# Patient Record
Sex: Female | Born: 1960 | Race: Black or African American | Hispanic: No | Marital: Single | Smoking: Never smoker
Health system: Southern US, Community
[De-identification: ages and names within clinical notes are randomized; demographics above are authoritative.]

## PROBLEM LIST (undated history)

## (undated) DIAGNOSIS — K219 Gastro-esophageal reflux disease without esophagitis: Secondary | ICD-10-CM

## (undated) DIAGNOSIS — J45909 Unspecified asthma, uncomplicated: Secondary | ICD-10-CM

## (undated) DIAGNOSIS — I10 Essential (primary) hypertension: Secondary | ICD-10-CM

## (undated) DIAGNOSIS — F32A Depression, unspecified: Secondary | ICD-10-CM

## (undated) DIAGNOSIS — E119 Type 2 diabetes mellitus without complications: Secondary | ICD-10-CM

## (undated) DIAGNOSIS — E876 Hypokalemia: Secondary | ICD-10-CM

## (undated) DIAGNOSIS — K589 Irritable bowel syndrome without diarrhea: Secondary | ICD-10-CM

## (undated) DIAGNOSIS — G4730 Sleep apnea, unspecified: Secondary | ICD-10-CM

## (undated) DIAGNOSIS — N183 Chronic kidney disease, stage 3 unspecified: Secondary | ICD-10-CM

## (undated) DIAGNOSIS — E039 Hypothyroidism, unspecified: Secondary | ICD-10-CM

## (undated) DIAGNOSIS — F329 Major depressive disorder, single episode, unspecified: Secondary | ICD-10-CM

## (undated) HISTORY — DX: Chronic kidney disease, stage 3 (moderate): N18.3

## (undated) HISTORY — DX: Hypokalemia: E87.6

## (undated) HISTORY — DX: Hypothyroidism, unspecified: E03.9

## (undated) HISTORY — DX: Sleep apnea, unspecified: G47.30

## (undated) HISTORY — DX: Chronic kidney disease, stage 3 unspecified: N18.30

## (undated) HISTORY — PX: BACK SURGERY: SHX140

## (undated) HISTORY — PX: OTHER SURGICAL HISTORY: SHX169

## (undated) HISTORY — PX: ABDOMINAL HYSTERECTOMY: SHX81

## (undated) HISTORY — DX: Gastro-esophageal reflux disease without esophagitis: K21.9

## (undated) HISTORY — DX: Type 2 diabetes mellitus without complications: E11.9

## (undated) HISTORY — DX: Depression, unspecified: F32.A

## (undated) HISTORY — DX: Major depressive disorder, single episode, unspecified: F32.9

## (undated) HISTORY — DX: Essential (primary) hypertension: I10

## (undated) HISTORY — PX: RETINAL TEAR REPAIR CRYOTHERAPY: SHX5304

## (undated) HISTORY — DX: Unspecified asthma, uncomplicated: J45.909

---

## 2001-07-12 DIAGNOSIS — M549 Dorsalgia, unspecified: Secondary | ICD-10-CM | POA: Insufficient documentation

## 2001-07-12 DIAGNOSIS — J309 Allergic rhinitis, unspecified: Secondary | ICD-10-CM | POA: Insufficient documentation

## 2001-07-12 DIAGNOSIS — Z9189 Other specified personal risk factors, not elsewhere classified: Secondary | ICD-10-CM | POA: Insufficient documentation

## 2006-11-29 DIAGNOSIS — E876 Hypokalemia: Secondary | ICD-10-CM | POA: Insufficient documentation

## 2008-12-12 DIAGNOSIS — M5416 Radiculopathy, lumbar region: Secondary | ICD-10-CM | POA: Insufficient documentation

## 2009-05-22 DIAGNOSIS — M519 Unspecified thoracic, thoracolumbar and lumbosacral intervertebral disc disorder: Secondary | ICD-10-CM | POA: Insufficient documentation

## 2009-05-22 DIAGNOSIS — M25512 Pain in left shoulder: Secondary | ICD-10-CM | POA: Insufficient documentation

## 2010-06-19 DIAGNOSIS — N83209 Unspecified ovarian cyst, unspecified side: Secondary | ICD-10-CM | POA: Insufficient documentation

## 2010-06-19 DIAGNOSIS — F419 Anxiety disorder, unspecified: Secondary | ICD-10-CM | POA: Insufficient documentation

## 2010-06-19 DIAGNOSIS — Z90711 Acquired absence of uterus with remaining cervical stump: Secondary | ICD-10-CM | POA: Insufficient documentation

## 2011-06-21 DIAGNOSIS — R200 Anesthesia of skin: Secondary | ICD-10-CM | POA: Insufficient documentation

## 2012-01-04 DIAGNOSIS — M2241 Chondromalacia patellae, right knee: Secondary | ICD-10-CM | POA: Insufficient documentation

## 2012-01-27 DIAGNOSIS — M94269 Chondromalacia, unspecified knee: Secondary | ICD-10-CM | POA: Insufficient documentation

## 2012-02-12 DIAGNOSIS — M25461 Effusion, right knee: Secondary | ICD-10-CM | POA: Insufficient documentation

## 2012-02-12 DIAGNOSIS — M25569 Pain in unspecified knee: Secondary | ICD-10-CM | POA: Insufficient documentation

## 2012-08-24 DIAGNOSIS — Z9114 Patient's other noncompliance with medication regimen: Secondary | ICD-10-CM | POA: Insufficient documentation

## 2014-09-20 DIAGNOSIS — G8929 Other chronic pain: Secondary | ICD-10-CM | POA: Insufficient documentation

## 2015-01-28 DIAGNOSIS — J4520 Mild intermittent asthma, uncomplicated: Secondary | ICD-10-CM | POA: Insufficient documentation

## 2015-04-10 DIAGNOSIS — K582 Mixed irritable bowel syndrome: Secondary | ICD-10-CM | POA: Insufficient documentation

## 2015-07-22 DIAGNOSIS — G4733 Obstructive sleep apnea (adult) (pediatric): Secondary | ICD-10-CM | POA: Insufficient documentation

## 2015-07-23 DIAGNOSIS — Z79899 Other long term (current) drug therapy: Secondary | ICD-10-CM | POA: Insufficient documentation

## 2015-07-23 DIAGNOSIS — N183 Chronic kidney disease, stage 3 unspecified: Secondary | ICD-10-CM | POA: Insufficient documentation

## 2015-08-14 DIAGNOSIS — F321 Major depressive disorder, single episode, moderate: Secondary | ICD-10-CM | POA: Insufficient documentation

## 2015-09-26 DIAGNOSIS — M170 Bilateral primary osteoarthritis of knee: Secondary | ICD-10-CM | POA: Insufficient documentation

## 2016-06-22 DIAGNOSIS — M5126 Other intervertebral disc displacement, lumbar region: Secondary | ICD-10-CM | POA: Insufficient documentation

## 2016-08-11 DIAGNOSIS — Z111 Encounter for screening for respiratory tuberculosis: Secondary | ICD-10-CM | POA: Insufficient documentation

## 2016-08-26 LAB — HEMOGLOBIN A1C: HEMOGLOBIN A1C: 11.8

## 2016-11-16 DIAGNOSIS — R079 Chest pain, unspecified: Secondary | ICD-10-CM | POA: Insufficient documentation

## 2016-11-16 DIAGNOSIS — E119 Type 2 diabetes mellitus without complications: Secondary | ICD-10-CM | POA: Insufficient documentation

## 2017-01-28 LAB — HEMOGLOBIN A1C: Hemoglobin A1C: 12

## 2017-04-26 LAB — HEMOGLOBIN A1C: HEMOGLOBIN A1C: 10.4

## 2017-06-01 ENCOUNTER — Ambulatory Visit (INDEPENDENT_AMBULATORY_CARE_PROVIDER_SITE_OTHER): Payer: 59 | Admitting: "Endocrinology

## 2017-06-01 ENCOUNTER — Encounter: Payer: Self-pay | Admitting: "Endocrinology

## 2017-06-01 VITALS — BP 133/83 | HR 86 | Ht 64.25 in | Wt 263.0 lb

## 2017-06-01 DIAGNOSIS — I10 Essential (primary) hypertension: Secondary | ICD-10-CM | POA: Diagnosis not present

## 2017-06-01 DIAGNOSIS — E1165 Type 2 diabetes mellitus with hyperglycemia: Secondary | ICD-10-CM | POA: Diagnosis not present

## 2017-06-01 DIAGNOSIS — E039 Hypothyroidism, unspecified: Secondary | ICD-10-CM

## 2017-06-01 MED ORDER — GLUCOSE BLOOD VI STRP: ORAL_STRIP | 2 refills | Status: DC

## 2017-06-01 MED ORDER — INSULIN PEN NEEDLE 31G X 8 MM MISC: 1.0000 | 3 refills | Status: AC

## 2017-06-01 MED ORDER — INSULIN GLARGINE 100 UNIT/ML SOLOSTAR PEN: 20.0000 [IU] | PEN_INJECTOR | Freq: Every day | SUBCUTANEOUS | 2 refills | Status: DC

## 2017-06-01 MED ORDER — ACCU-CHEK GUIDE W/DEVICE KIT: 1.0000 | PACK | 0 refills | Status: DC

## 2017-06-01 MED ORDER — LANCETS MISC: 1.0000 | 3 refills | Status: DC

## 2017-06-01 NOTE — Patient Instructions (Signed)

## 2017-06-01 NOTE — Progress Notes (Signed)
Consult Note       06/01/2017, 3:37 PM   Subjective:    Patient ID: Sharon Carson, female    DOB: June 12, 1960.  Sharon Carson is being seen in consultation for management of currently uncontrolled symptomatic diabetes requested by  Eber Hong, MD.   Past Medical History:  Diagnosis Date  . Asthma   . CKD (chronic kidney disease), stage III ([place])   . Depression   . Diabetes mellitus, type II ([place])   . GERD (gastroesophageal reflux disease)   . Hypertension   . Hypokalemia   . Hypothyroidism   . Sleep apnea    Past Surgical History:  Procedure Laterality Date  . ABDOMINAL HYSTERECTOMY    . CESAREAN SECTION    . RETINAL TEAR REPAIR CRYOTHERAPY     Social History   Socioeconomic History  . Marital status: Single    Spouse name: None  . Number of children: None  . Years of education: None  . Highest education level: None  Social Needs  . Financial resource strain: None  . Food insecurity - worry: None  . Food insecurity - inability: None  . Transportation needs - medical: None  . Transportation needs - non-medical: None  Occupational History  . None  Tobacco Use  . Smoking status: Never Smoker  . Smokeless tobacco: Never Used  Substance and Sexual Activity  . Alcohol use: No    Frequency: Never  . Drug use: No  . Sexual activity: None  Other Topics Concern  . None  Social History Narrative  . None   Outpatient Encounter Medications as of 06/01/2017  Medication Sig  . irbesartan (AVAPRO) 150 MG tablet Take 150 mg by mouth daily.  Marland Kitchen levothyroxine (SYNTHROID, LEVOTHROID) 88 MCG tablet Take 88 mcg by mouth daily before breakfast.  . metFORMIN (GLUCOPHAGE) 1000 MG tablet Take 1,000 mg by mouth 2 (two) times daily with a meal.  . pioglitazone (ACTOS) 30 MG tablet Take 30 mg by mouth 2 (two) times daily.  . [DISCONTINUED] glipiZIDE (GLUCOTROL) 5 MG tablet Take 5 mg by mouth  daily before breakfast.  . Blood Glucose Monitoring Suppl (ACCU-CHEK GUIDE) w/Device KIT 1 Piece by Does not apply route as directed.  Marland Kitchen glucose blood (ACCU-CHEK GUIDE) test strip Use as instructed  . Insulin Glargine (LANTUS SOLOSTAR) 100 UNIT/ML Solostar Pen Inject 20 Units into the skin daily at 10 pm.  . Insulin Pen Needle (B-D ULTRAFINE III SHORT PEN) 31G X 8 MM MISC 1 each by Does not apply route as directed.  . Lancets MISC 1 each by Does not apply route as directed.   No facility-administered encounter medications on file as of 06/01/2017.     ALLERGIES: Allergies  Allergen Reactions  . Bactrim [Sulfamethoxazole-Trimethoprim]   . Hychlor [Hydrochlorothiazide]   . Norvasc [Amlodipine Besylate]     VACCINATION STATUS:  There is no immunization history on file for this patient.  Diabetes  She presents for her initial diabetic visit. She has type 2 diabetes mellitus. Onset time: She was diagnosed at approximate age of 44 years. Her disease course has been improving (Her last 3  A1c's are 12% in October 2018, 11.8% in May 2018, and 10.4% on April 26, 2017.). There are no hypoglycemic associated symptoms. Pertinent negatives for hypoglycemia include no confusion, headaches, pallor or seizures. Associated symptoms include fatigue, polydipsia and polyuria. Pertinent negatives for diabetes include no chest pain and no polyphagia. There are no hypoglycemic complications. Symptoms are improving. There are no diabetic complications. Risk factors for coronary artery disease include diabetes mellitus, hypertension, obesity and sedentary lifestyle. Current diabetic treatment includes oral agent (triple therapy). Her weight is increasing steadily. She is following a generally unhealthy diet. When asked about meal planning, she reported none. She has not had a previous visit with a dietitian. She never participates in exercise. (She did not bring any meter no logs to review today.  She admits that she  does not have a meter.  She is trying to be cleared for shoulder surgery, however willing to be treated for her diabetes before her surgery.) An ACE inhibitor/angiotensin II receptor blocker is being taken. She does not see a podiatrist.Eye exam is not current.  Hypertension  This is a chronic problem. The current episode started more than 1 year ago. The problem is controlled. Pertinent negatives include no chest pain, headaches, palpitations or shortness of breath. Risk factors for coronary artery disease include diabetes mellitus, dyslipidemia, obesity and sedentary lifestyle. Past treatments include angiotensin blockers.      Review of Systems  Constitutional: Positive for fatigue. Negative for chills, fever and unexpected weight change.  HENT: Negative for trouble swallowing and voice change.   Eyes: Negative for visual disturbance.  Respiratory: Negative for cough, shortness of breath and wheezing.   Cardiovascular: Negative for chest pain, palpitations and leg swelling.  Gastrointestinal: Negative for diarrhea, nausea and vomiting.  Endocrine: Positive for polydipsia and polyuria. Negative for cold intolerance, heat intolerance and polyphagia.  Musculoskeletal: Negative for arthralgias and myalgias.  Skin: Negative for color change, pallor, rash and wound.  Neurological: Negative for seizures and headaches.  Psychiatric/Behavioral: Negative for confusion and suicidal ideas.    Objective:    BP 133/83   Pulse 86   Ht 5' 4.25" (1.632 m)   Wt 263 lb (119.3 kg)   BMI 44.79 kg/m   Wt Readings from Last 3 Encounters:  06/01/17 263 lb (119.3 kg)     Physical Exam  Constitutional: She is oriented to person, place, and time. She appears well-developed.  HENT:  Head: Normocephalic and atraumatic.  Eyes: EOM are normal.  Neck: Normal range of motion. Neck supple. No tracheal deviation present. No thyromegaly present.  Cardiovascular: Normal rate and regular rhythm.   Pulmonary/Chest: Effort normal and breath sounds normal.  Abdominal: Soft. Bowel sounds are normal. There is no tenderness. There is no guarding.  Obese.  Musculoskeletal: She exhibits no edema.  Her right arm is in slings with limited range of motion of right shoulder.  Neurological: She is alert and oriented to person, place, and time. She has normal reflexes. No cranial nerve deficit. Coordination normal.  Skin: Skin is warm and dry. No rash noted. No erythema. No pallor.  Psychiatric: She has a normal mood and affect. Judgment normal.    Recent Results (from the past 2160 hour(s))  Hemoglobin A1c     Status: None   Collection Time: 04/26/17 12:00 AM  Result Value Ref Range   Hemoglobin A1C 10.4         Assessment & Plan:   1. Uncontrolled type 2 diabetes mellitus with hyperglycemia ([place])  -  Sharon Carson has currently uncontrolled symptomatic type 2 DM since 57 years of age,  with most recent A1c of 10.4 %. Recent labs reviewed.  Her prior to A1c measurements were 11.8% and 12%.  -her diabetes is complicated by obesity/sedentary life and Sharon Carson remains at a high risk for more acute and chronic complications which include CAD, CVA, CKD, retinopathy, and neuropathy. These are all discussed in detail with the patient.  - I have counseled her on diet management and weight loss, by adopting a carbohydrate restricted/protein rich diet.  - Suggestion is made for her to avoid simple carbohydrates  from her diet including Cakes, Sweet Desserts, Ice Cream, Soda (diet and regular), Sweet Tea, Candies, Chips, Cookies, Store Bought Juices, Alcohol in Excess of  1-2 drinks a day, Artificial Sweeteners, and "Sugar-free" Products. This will help patient to have stable blood glucose profile and potentially avoid unintended weight gain.  - I encouraged her to switch to  unprocessed or minimally processed complex starch and increased protein intake (animal or plant source),  fruits, and vegetables.  - she is advised to stick to a routine mealtimes to eat 3 meals  a day and avoid unnecessary snacks ( to snack only to correct hypoglycemia).   - she will be scheduled with Jearld Fenton, RDN, CDE for individualized diabetes education.  - I have approached her with the following individualized plan to manage diabetes and patient agrees:   -She is being evaluated for rotator cuff surgery on her right shoulder electively.  Currently she is not cleared from diabetes point of view.  -She is willing to engage to treat his diabetes before her surgery.  -She may require intensive treatment with basal/bolus insulin in order for her to achieve control of diabetes to target.  -I discussed and initiated basal insulin Lantus at 20 units nightly, associated with  strict monitoring of glucose 4 times a day-before meals and at bedtime. -She is requested to return in 1 week with her meter and logs for reevaluation and insulin dose adjustment. -Testing supplies were prescribed for her.  -Patient is encouraged to call clinic for blood glucose levels less than 70 or above 200 mg /dl. - I will continue metformin 1000 mg p.o. twice daily and lower  Actos to  30 mg p.o. daily, therapeutically suitable for patient . - I will discontinue glipizide, risk outweighs benefit for this patient.  - she will be considered for incretin therapy as appropriate next visit. - Patient specific target  A1c;  LDL, HDL, Triglycerides, and  Waist Circumference were discussed in detail.  2) BP/HTN: Her blood pressure is controlled to target. Continue current medications including irbesartan 150 mg p.o. every morning. 3) Lipids/HPL: No recent lipid panel to review, she is not on statin medications.  She will have fasting lipid panel on subsequent visits. 4)  Weight/Diet: CDE Consult will be initiated , exercise, and detailed carbohydrates information provided.  5) hypothyroidism: Clinically euthyroid, no  recent thyroid function test to review. -I have advised her to continue levothyroxine 88 mcg p.o. every morning.  - We discussed about correct intake of levothyroxine, at fasting, with water, separated by at least 30 minutes from breakfast, and separated by more than 4 hours from calcium, iron, multivitamins, acid reflux medications (PPIs). -Patient is made aware of the fact that thyroid hormone replacement is needed for life, dose to be adjusted by periodic monitoring of thyroid function tests.  6) Chronic Care/Health Maintenance:  -she  Is ARB  and not on  Statin medications and  is encouraged to continue to follow up with Ophthalmology, Dentist,  Podiatrist at least yearly or according to recommendations, and advised to   stay away from smoking. I have recommended yearly flu vaccine and pneumonia vaccination at least every 5 years; moderate intensity exercise for up to 150 minutes weekly; and  sleep for at least 7 hours a day. -Once her average blood glucose drops to below 180 mg/dL, she may be considered for her elective shoulder surgery.  - I advised patient to maintain close follow up with Eber Hong, MD for primary care needs.  - Time spent with the patient: 1 hour, of which >50% was spent in obtaining information about her symptoms, reviewing her previous labs, evaluations, and treatments, counseling her about her currently uncontrolled type 2 diabetes, hypertension, and developing a plan for long term treatment; her  questions were answered to her satisfaction.  Follow up plan: - Return in about 1 week (around 06/08/2017) for follow up with meter and logs- no labs.  Glade Lloyd, MD [facility] [address] [place], [place] [address] Phone: [phone]  Fax: [phone]    06/01/2017, 3:37 PM  This note was partially dictated with voice recognition software. Similar sounding words can be transcribed inadequately or may not  be  corrected upon review.

## 2017-06-02 ENCOUNTER — Telehealth: Payer: Self-pay | Admitting: "Endocrinology

## 2017-06-02 ENCOUNTER — Other Ambulatory Visit: Payer: Self-pay

## 2017-06-02 MED ORDER — BLOOD GLUCOSE MONITOR KIT: PACK | 5 refills | Status: DC

## 2017-06-02 MED ORDER — LANCETS MISC: 1.0000 | Freq: Four times a day (QID) | 5 refills | Status: DC

## 2017-06-02 MED ORDER — TRUE METRIX METER W/DEVICE KIT: PACK | 0 refills | Status: DC

## 2017-06-02 MED ORDER — GLUCOSE BLOOD VI STRP: ORAL_STRIP | 5 refills | Status: DC

## 2017-06-02 NOTE — Telephone Encounter (Signed)
Rx sent 

## 2017-06-02 NOTE — Telephone Encounter (Signed)
Sharon Carson is calling stating that the glucose meter that Dr Fransico HimNida prescribed yesterday is not covered by her insurance and she is requesting a meter that is , please advise?

## 2017-06-09 ENCOUNTER — Telehealth: Payer: Self-pay | Admitting: "Endocrinology

## 2017-06-09 NOTE — Telephone Encounter (Signed)
Sharon Carson is stating that her blood sugars have been running low  02/04 Bb-116 bl-99 Bd-79 bt-100  02/05 Bb-78 bl-53 Bd-98 bt-116  02/06- Bb-48 bl-136 Bd-108 bt-102  02/07 Bb-58 bl-136  Please advise

## 2017-06-09 NOTE — Telephone Encounter (Signed)
She can stop Lantus, new metformin 1000 mg p.o. twice daily.  continue to monitor blood glucose 4 times a day and bring her meter and logs on her return visit.

## 2017-06-09 NOTE — Telephone Encounter (Signed)
Pt.notified

## 2017-06-10 NOTE — Telephone Encounter (Signed)
OPENED IN ERROR

## 2017-06-14 ENCOUNTER — Ambulatory Visit: Payer: 59 | Admitting: "Endocrinology

## 2017-06-20 ENCOUNTER — Ambulatory Visit (INDEPENDENT_AMBULATORY_CARE_PROVIDER_SITE_OTHER): Payer: 59 | Admitting: "Endocrinology

## 2017-06-20 ENCOUNTER — Encounter: Payer: Self-pay | Admitting: "Endocrinology

## 2017-06-20 VITALS — BP 120/84 | HR 101 | Ht 64.25 in | Wt 272.0 lb

## 2017-06-20 DIAGNOSIS — E039 Hypothyroidism, unspecified: Secondary | ICD-10-CM | POA: Diagnosis not present

## 2017-06-20 DIAGNOSIS — E1165 Type 2 diabetes mellitus with hyperglycemia: Secondary | ICD-10-CM

## 2017-06-20 DIAGNOSIS — I10 Essential (primary) hypertension: Secondary | ICD-10-CM

## 2017-06-20 NOTE — Patient Instructions (Signed)

## 2017-06-20 NOTE — Progress Notes (Signed)
Consult Note       06/20/2017, 2:43 PM   Subjective:    Patient ID: Sharon Carson, female    DOB: 07-Aug-1960.  Sharon Carson is being seen in follow-up for management of currently uncontrolled symptomatic diabetes requested by  Eber Hong, MD.   Past Medical History:  Diagnosis Date  . Asthma   . CKD (chronic kidney disease), stage III ([place])   . Depression   . Diabetes mellitus, type II ([place])   . GERD (gastroesophageal reflux disease)   . Hypertension   . Hypokalemia   . Hypothyroidism   . Sleep apnea    Past Surgical History:  Procedure Laterality Date  . ABDOMINAL HYSTERECTOMY    . CESAREAN SECTION    . RETINAL TEAR REPAIR CRYOTHERAPY     Social History   Socioeconomic History  . Marital status: Single    Spouse name: None  . Number of children: None  . Years of education: None  . Highest education level: None  Social Needs  . Financial resource strain: None  . Food insecurity - worry: None  . Food insecurity - inability: None  . Transportation needs - medical: None  . Transportation needs - non-medical: None  Occupational History  . None  Tobacco Use  . Smoking status: Never Smoker  . Smokeless tobacco: Never Used  Substance and Sexual Activity  . Alcohol use: No    Frequency: Never  . Drug use: No  . Sexual activity: None  Other Topics Concern  . None  Social History Narrative  . None   Outpatient Encounter Medications as of 06/20/2017  Medication Sig  . Blood Glucose Monitoring Suppl (TRUE METRIX METER) w/Device KIT Test BG qid. E11.65  . glucose blood (TRUE METRIX BLOOD GLUCOSE TEST) test strip Use as instructed 4 x daily. E11.65  . Insulin Pen Needle (B-D ULTRAFINE III SHORT PEN) 31G X 8 MM MISC 1 each by Does not apply route as directed.  . irbesartan (AVAPRO) 150 MG tablet Take 150 mg by mouth daily.  . Lancets MISC 1 each by Does not apply route as  directed.  . Lancets MISC 1 each by Does not apply route 4 (four) times daily.  Marland Kitchen levothyroxine (SYNTHROID, LEVOTHROID) 88 MCG tablet Take 88 mcg by mouth daily before breakfast.  . metFORMIN (GLUCOPHAGE) 1000 MG tablet Take 1,000 mg by mouth 2 (two) times daily with a meal.  . pioglitazone (ACTOS) 30 MG tablet Take 30 mg by mouth daily.  . [DISCONTINUED] blood glucose meter kit and supplies KIT Dispense based on patient and insurance preference. Use up to four times daily as directed. (FOR ICD-10 E11.65)  . [DISCONTINUED] glucose blood (ACCU-CHEK GUIDE) test strip Use as instructed  . [DISCONTINUED] Insulin Glargine (LANTUS SOLOSTAR) 100 UNIT/ML Solostar Pen Inject 20 Units into the skin daily at 10 pm.   No facility-administered encounter medications on file as of 06/20/2017.     ALLERGIES: Allergies  Allergen Reactions  . Bactrim [Sulfamethoxazole-Trimethoprim]   . Hychlor [Hydrochlorothiazide]   . Norvasc [Amlodipine Besylate]     VACCINATION STATUS:  There is no immunization history on  file for this patient.  Diabetes  She presents for her follow-up diabetic visit. She has type 2 diabetes mellitus. Onset time: She was diagnosed at approximate age of 57 years. Her disease course has been improving (Her last 3 A1c's are 12% in October 2018, 11.8% in May 2018, and 10.4% on April 26, 2017.). There are no hypoglycemic associated symptoms. Pertinent negatives for hypoglycemia include no confusion, headaches, pallor or seizures. Associated symptoms include fatigue. Pertinent negatives for diabetes include no chest pain, no polydipsia, no polyphagia and no polyuria. There are no hypoglycemic complications. Symptoms are improving. There are no diabetic complications. Risk factors for coronary artery disease include diabetes mellitus, hypertension, obesity and sedentary lifestyle. Current diabetic treatment includes oral agent (triple therapy). Her weight is increasing steadily. She is following  a generally unhealthy diet. When asked about meal planning, she reported none. She has not had a previous visit with a dietitian. She never participates in exercise. Her breakfast blood glucose range is generally 70-90 mg/dl. Her lunch blood glucose range is generally 90-110 mg/dl. Her dinner blood glucose range is generally 90-110 mg/dl. Her bedtime blood glucose range is generally 90-110 mg/dl. Her overall blood glucose range is 90-110 mg/dl. (After initiation of basal insulin, she returns with tight blood glucose profile both fasting and postprandial bloating hypoglycemia as low as 54.) An ACE inhibitor/angiotensin II receptor blocker is being taken. She does not see a podiatrist.Eye exam is not current.  Hypertension  This is a chronic problem. The current episode started more than 1 year ago. The problem is controlled. Pertinent negatives include no chest pain, headaches, palpitations or shortness of breath. Risk factors for coronary artery disease include diabetes mellitus, dyslipidemia, obesity and sedentary lifestyle. Past treatments include angiotensin blockers.     Review of Systems  Constitutional: Positive for fatigue. Negative for chills, fever and unexpected weight change.  HENT: Negative for trouble swallowing and voice change.   Eyes: Negative for visual disturbance.  Respiratory: Negative for cough, shortness of breath and wheezing.   Cardiovascular: Negative for chest pain, palpitations and leg swelling.  Gastrointestinal: Negative for diarrhea, nausea and vomiting.  Endocrine: Negative for cold intolerance, heat intolerance, polydipsia, polyphagia and polyuria.  Musculoskeletal: Negative for arthralgias and myalgias.  Skin: Negative for color change, pallor, rash and wound.  Neurological: Negative for seizures and headaches.  Psychiatric/Behavioral: Negative for confusion and suicidal ideas.    Objective:    BP 120/84   Pulse (!) 101   Ht 5' 4.25" (1.632 m)   Wt 272 lb  (123.4 kg)   BMI 46.33 kg/m   Wt Readings from Last 3 Encounters:  06/20/17 272 lb (123.4 kg)  06/01/17 263 lb (119.3 kg)     Physical Exam  Constitutional: She is oriented to person, place, and time. She appears well-developed.  HENT:  Head: Normocephalic and atraumatic.  Eyes: EOM are normal.  Neck: Normal range of motion. Neck supple. No tracheal deviation present. No thyromegaly present.  Cardiovascular: Normal rate.  Abdominal: Bowel sounds are normal. There is no tenderness. There is no guarding.  Obese.  Musculoskeletal: She exhibits no edema.  Her right arm is not in slings anymore, however still has limited range of motion of right shoulder.  Neurological: She is alert and oriented to person, place, and time. She has normal reflexes. No cranial nerve deficit. Coordination normal.  Skin: Skin is warm and dry. No rash noted. No erythema. No pallor.  Psychiatric: She has a normal mood and affect. Judgment  normal.    Recent Results (from the past 2160 hour(s))  Hemoglobin A1c     Status: None   Collection Time: 04/26/17 12:00 AM  Result Value Ref Range   Hemoglobin A1C 10.4      Assessment & Plan:   1. Uncontrolled type 2 diabetes mellitus with hyperglycemia (HCC)  - Sharon Carson has currently uncontrolled symptomatic type 2 DM since 57 years of age. -She returns with tightly controlled glycemic profile consistent with quick response to basal insulin.  Her most recent A1c was 10.4%.   Her prior 2 A1c measurements were 11.8% and 12%.  -her diabetes is complicated by obesity/sedentary life and Sharon Carson remains at a high risk for more acute and chronic complications which include CAD, CVA, CKD, retinopathy, and neuropathy. These are all discussed in detail with the patient.  - I have counseled her on diet management and weight loss, by adopting a carbohydrate restricted/protein rich diet.  -  Suggestion is made for her to avoid simple carbohydrates  from  her diet including Cakes, Sweet Desserts / Pastries, Ice Cream, Soda (diet and regular), Sweet Tea, Candies, Chips, Cookies, Store Bought Juices, Alcohol in Excess of  1-2 drinks a day, Artificial Sweeteners, and "Sugar-free" Products. This will help patient to have stable blood glucose profile and potentially avoid unintended weight gain.   - I encouraged her to switch to  unprocessed or minimally processed complex starch and increased protein intake (animal or plant source), fruits, and vegetables.  - she is advised to stick to a routine mealtimes to eat 3 meals  a day and avoid unnecessary snacks ( to snack only to correct hypoglycemia).    - I have approached her with the following individualized plan to manage diabetes and patient agrees:   -Based on her quick response to basal insulin with tight glycemic profile both fasting and postprandial, she be taken off of insulin at this time.  -She is clear for her planned elective rotator cuff surgery on her right shoulder.   -She is willing to stay engaged in lifestyle modification to maintain control of diabetes.    -Patient is encouraged to call clinic for blood glucose levels less than 70 or above 200 mg /dl. - I will continue metformin 1000 mg p.o. twice daily and  Actos   30 mg p.o. daily, therapeutically suitable for patient .  - she will be considered for incretin therapy as appropriate next visit. - Patient specific target  A1c;  LDL, HDL, Triglycerides, and  Waist Circumference were discussed in detail.  2) BP/HTN: Her blood pressure is controlled to target. Continue current medications including irbesartan 150 mg p.o. every morning. 3) Lipids/HPL: No recent lipid panel to review, she is not on statin medications.  She will have fasting lipid panel on subsequent visits. 4)  Weight/Diet: CDE Consult will be initiated , exercise, and detailed carbohydrates information provided.  5) hypothyroidism: Clinically euthyroid, no recent  thyroid function test to review. -I have advised her to continue levothyroxine 88 mcg p.o. every morning.  - We discussed about correct intake of levothyroxine, at fasting, with water, separated by at least 30 minutes from breakfast, and separated by more than 4 hours from calcium, iron, multivitamins, acid reflux medications (PPIs). -Patient is made aware of the fact that thyroid hormone replacement is needed for life, dose to be adjusted by periodic monitoring of thyroid function tests.  6) Chronic Care/Health Maintenance:  -she  Is ARB and not on  Statin medications and  is encouraged to continue to follow up with Ophthalmology, Dentist,  Podiatrist at least yearly or according to recommendations, and advised to   stay away from smoking. I have recommended yearly flu vaccine and pneumonia vaccination at least every 5 years; moderate intensity exercise for up to 150 minutes weekly; and  sleep for at least 7 hours a day. -She is clear for her elective surgery from diabetes point of view.    - I advised patient to maintain close follow up with Eber Hong, MD for primary care needs.  - Time spent with the patient: 25 min, of which >50% was spent in reviewing her blood glucose logs , discussing her hypo- and hyper-glycemic episodes, reviewing her current and  previous labs and insulin doses and developing a plan to avoid hypo- and hyper-glycemia. Please refer to Patient Instructions for Blood Glucose Monitoring and Insulin/Medications Dosing Guide"  in media tab for additional information. Sharon Carson participated in the discussions, expressed understanding, and voiced agreement with the above plans.  All questions were answered to her satisfaction. she is encouraged to contact clinic should she have any questions or concerns prior to her return visit.   Follow up plan: - Return in about 6 weeks (around 08/01/2017) for meter, and logs.  Glade Lloyd, MD [facility] [address] [place], [place] [address] Phone: [phone]  Fax: [phone]    06/20/2017, 2:43 PM  This note was partially dictated with voice recognition software. Similar sounding words can be transcribed inadequately or may not  be corrected upon review.

## 2017-06-22 ENCOUNTER — Ambulatory Visit: Payer: Self-pay | Admitting: "Endocrinology

## 2017-07-14 DIAGNOSIS — G5711 Meralgia paresthetica, right lower limb: Secondary | ICD-10-CM | POA: Insufficient documentation

## 2017-07-14 DIAGNOSIS — E1142 Type 2 diabetes mellitus with diabetic polyneuropathy: Secondary | ICD-10-CM | POA: Insufficient documentation

## 2017-07-14 DIAGNOSIS — M75101 Unspecified rotator cuff tear or rupture of right shoulder, not specified as traumatic: Secondary | ICD-10-CM | POA: Insufficient documentation

## 2017-08-08 ENCOUNTER — Ambulatory Visit: Payer: 59 | Admitting: "Endocrinology

## 2017-08-09 ENCOUNTER — Ambulatory Visit: Payer: Self-pay | Admitting: Nutrition

## 2019-08-13 ENCOUNTER — Other Ambulatory Visit: Payer: Self-pay

## 2019-08-13 ENCOUNTER — Encounter (HOSPITAL_COMMUNITY): Payer: Self-pay

## 2019-08-13 ENCOUNTER — Observation Stay (HOSPITAL_COMMUNITY)
Admission: EM | Admit: 2019-08-13 | Discharge: 2019-08-14 | Disposition: A | Discharge status: Home / Self Care | Payer: 59 | Attending: Internal Medicine | Admitting: Internal Medicine

## 2019-08-13 DIAGNOSIS — W57XXXA Bitten or stung by nonvenomous insect and other nonvenomous arthropods, initial encounter: Secondary | ICD-10-CM | POA: Diagnosis not present

## 2019-08-13 DIAGNOSIS — I10 Essential (primary) hypertension: Secondary | ICD-10-CM | POA: Diagnosis not present

## 2019-08-13 DIAGNOSIS — K589 Irritable bowel syndrome without diarrhea: Secondary | ICD-10-CM | POA: Insufficient documentation

## 2019-08-13 DIAGNOSIS — Z8709 Personal history of other diseases of the respiratory system: Secondary | ICD-10-CM

## 2019-08-13 DIAGNOSIS — E1122 Type 2 diabetes mellitus with diabetic chronic kidney disease: Secondary | ICD-10-CM | POA: Diagnosis not present

## 2019-08-13 DIAGNOSIS — E1165 Type 2 diabetes mellitus with hyperglycemia: Secondary | ICD-10-CM | POA: Insufficient documentation

## 2019-08-13 DIAGNOSIS — Z8349 Family history of other endocrine, nutritional and metabolic diseases: Secondary | ICD-10-CM | POA: Diagnosis not present

## 2019-08-13 DIAGNOSIS — Z20822 Contact with and (suspected) exposure to covid-19: Secondary | ICD-10-CM | POA: Diagnosis not present

## 2019-08-13 DIAGNOSIS — S40862A Insect bite (nonvenomous) of left upper arm, initial encounter: Secondary | ICD-10-CM | POA: Insufficient documentation

## 2019-08-13 DIAGNOSIS — Z6841 Body Mass Index (BMI) 40.0 and over, adult: Secondary | ICD-10-CM | POA: Diagnosis not present

## 2019-08-13 DIAGNOSIS — E785 Hyperlipidemia, unspecified: Secondary | ICD-10-CM | POA: Insufficient documentation

## 2019-08-13 DIAGNOSIS — M5430 Sciatica, unspecified side: Secondary | ICD-10-CM | POA: Insufficient documentation

## 2019-08-13 DIAGNOSIS — K219 Gastro-esophageal reflux disease without esophagitis: Secondary | ICD-10-CM | POA: Insufficient documentation

## 2019-08-13 DIAGNOSIS — Z79899 Other long term (current) drug therapy: Secondary | ICD-10-CM | POA: Diagnosis not present

## 2019-08-13 DIAGNOSIS — Z881 Allergy status to other antibiotic agents status: Secondary | ICD-10-CM | POA: Insufficient documentation

## 2019-08-13 DIAGNOSIS — Z8249 Family history of ischemic heart disease and other diseases of the circulatory system: Secondary | ICD-10-CM | POA: Diagnosis not present

## 2019-08-13 DIAGNOSIS — N1832 Chronic kidney disease, stage 3b: Secondary | ICD-10-CM | POA: Insufficient documentation

## 2019-08-13 DIAGNOSIS — Z794 Long term (current) use of insulin: Secondary | ICD-10-CM | POA: Diagnosis not present

## 2019-08-13 DIAGNOSIS — G4733 Obstructive sleep apnea (adult) (pediatric): Secondary | ICD-10-CM | POA: Insufficient documentation

## 2019-08-13 DIAGNOSIS — L0390 Cellulitis, unspecified: Secondary | ICD-10-CM | POA: Diagnosis present

## 2019-08-13 DIAGNOSIS — E039 Hypothyroidism, unspecified: Secondary | ICD-10-CM | POA: Insufficient documentation

## 2019-08-13 DIAGNOSIS — Z882 Allergy status to sulfonamides status: Secondary | ICD-10-CM | POA: Diagnosis not present

## 2019-08-13 DIAGNOSIS — G8929 Other chronic pain: Secondary | ICD-10-CM | POA: Insufficient documentation

## 2019-08-13 DIAGNOSIS — I129 Hypertensive chronic kidney disease with stage 1 through stage 4 chronic kidney disease, or unspecified chronic kidney disease: Secondary | ICD-10-CM | POA: Insufficient documentation

## 2019-08-13 DIAGNOSIS — L03114 Cellulitis of left upper limb: Secondary | ICD-10-CM | POA: Diagnosis present

## 2019-08-13 DIAGNOSIS — L239 Allergic contact dermatitis, unspecified cause: Secondary | ICD-10-CM

## 2019-08-13 DIAGNOSIS — J45909 Unspecified asthma, uncomplicated: Secondary | ICD-10-CM | POA: Diagnosis not present

## 2019-08-13 DIAGNOSIS — Z79890 Hormone replacement therapy (postmenopausal): Secondary | ICD-10-CM | POA: Insufficient documentation

## 2019-08-13 DIAGNOSIS — Z888 Allergy status to other drugs, medicaments and biological substances status: Secondary | ICD-10-CM | POA: Diagnosis not present

## 2019-08-13 HISTORY — DX: Irritable bowel syndrome, unspecified: K58.9

## 2019-08-13 LAB — CBC WITH DIFFERENTIAL/PLATELET
Abs Immature Granulocytes: 0.02 10*3/uL (ref 0.00–0.07)
Basophils Absolute: 0.1 10*3/uL (ref 0.0–0.1)
Basophils Relative: 1 %
Eosinophils Absolute: 0.2 10*3/uL (ref 0.0–0.5)
Eosinophils Relative: 2 %
HCT: 42.9 % (ref 36.0–46.0)
Hemoglobin: 14.2 g/dL (ref 12.0–15.0)
Immature Granulocytes: 0 %
Lymphocytes Relative: 45 %
Lymphs Abs: 4.9 10*3/uL — ABNORMAL HIGH (ref 0.7–4.0)
MCH: 28.9 pg (ref 26.0–34.0)
MCHC: 33.1 g/dL (ref 30.0–36.0)
MCV: 87.4 fL (ref 80.0–100.0)
Monocytes Absolute: 0.7 10*3/uL (ref 0.1–1.0)
Monocytes Relative: 6 %
Neutro Abs: 5.1 10*3/uL (ref 1.7–7.7)
Neutrophils Relative %: 46 %
Platelets: 342 10*3/uL (ref 150–400)
RBC: 4.91 MIL/uL (ref 3.87–5.11)
RDW: 13.4 % (ref 11.5–15.5)
WBC: 10.9 10*3/uL — ABNORMAL HIGH (ref 4.0–10.5)
nRBC: 0 % (ref 0.0–0.2)

## 2019-08-13 LAB — BASIC METABOLIC PANEL
Anion gap: 11 (ref 5–15)
BUN: 16 mg/dL (ref 6–20)
CO2: 20 mmol/L — ABNORMAL LOW (ref 22–32)
Calcium: 9 mg/dL (ref 8.9–10.3)
Chloride: 105 mmol/L (ref 98–111)
Creatinine, Ser: 0.84 mg/dL (ref 0.44–1.00)
GFR calc Af Amer: >60 mL/min (ref >60)
GFR calc non Af Amer: >60 mL/min (ref >60)
Glucose, Bld: 298 mg/dL — ABNORMAL HIGH (ref 70–99)
Potassium: 3.7 mmol/L (ref 3.5–5.1)
Sodium: 136 mmol/L (ref 135–145)

## 2019-08-13 LAB — LACTIC ACID, PLASMA: Lactic Acid, Venous: 1.5 mmol/L (ref 0.5–1.9)

## 2019-08-13 MED ORDER — KETOROLAC TROMETHAMINE 15 MG/ML IJ SOLN: 15.0000 mg | Freq: Once | INTRAMUSCULAR | Status: DC

## 2019-08-13 MED ORDER — CLINDAMYCIN PHOSPHATE 600 MG/50ML IV SOLN
600.0000 mg | Freq: Once | INTRAVENOUS | Status: AC
  Administered 2019-08-13: 600 mg via INTRAVENOUS
  Filled 2019-08-13: qty 50

## 2019-08-13 MED ORDER — INSULIN ASPART 100 UNIT/ML ~~LOC~~ SOLN
0.0000 [IU] | Freq: Three times a day (TID) | SUBCUTANEOUS | Status: DC
  Administered 2019-08-14: 15 [IU] via SUBCUTANEOUS
  Filled 2019-08-13: qty 0.15

## 2019-08-13 MED ORDER — INSULIN ASPART 100 UNIT/ML ~~LOC~~ SOLN
0.0000 [IU] | Freq: Every day | SUBCUTANEOUS | Status: DC
  Administered 2019-08-14: 5 [IU] via SUBCUTANEOUS
  Filled 2019-08-13: qty 0.05

## 2019-08-13 MED ORDER — METHYLPREDNISOLONE SODIUM SUCC 40 MG IJ SOLR
40.0000 mg | Freq: Once | INTRAMUSCULAR | Status: AC
  Administered 2019-08-14: 40 mg via INTRAVENOUS
  Filled 2019-08-13: qty 1

## 2019-08-13 MED ORDER — DIPHENHYDRAMINE HCL 50 MG/ML IJ SOLN
12.5000 mg | Freq: Once | INTRAMUSCULAR | Status: AC
  Administered 2019-08-13: 12.5 mg via INTRAVENOUS
  Filled 2019-08-13: qty 1

## 2019-08-13 MED ORDER — SODIUM CHLORIDE 0.9 % IV SOLN
2.0000 g | INTRAVENOUS | Status: DC
  Administered 2019-08-14: 2 g via INTRAVENOUS
  Filled 2019-08-13: qty 2

## 2019-08-13 MED ORDER — ENOXAPARIN SODIUM 40 MG/0.4ML ~~LOC~~ SOLN
40.0000 mg | SUBCUTANEOUS | Status: DC
  Administered 2019-08-14: 40 mg via SUBCUTANEOUS
  Filled 2019-08-13: qty 0.4

## 2019-08-13 MED ORDER — LEVOTHYROXINE SODIUM 88 MCG PO TABS
88.0000 ug | ORAL_TABLET | Freq: Every day | ORAL | Status: DC
  Administered 2019-08-14: 88 ug via ORAL
  Filled 2019-08-13: qty 1

## 2019-08-13 MED ORDER — PROCHLORPERAZINE EDISYLATE 10 MG/2ML IJ SOLN
10.0000 mg | Freq: Once | INTRAMUSCULAR | Status: AC
  Administered 2019-08-13: 22:00:00 10 mg via INTRAVENOUS
  Filled 2019-08-13: qty 2

## 2019-08-13 MED ORDER — DIPHENHYDRAMINE HCL 25 MG PO CAPS: 25.0000 mg | ORAL_CAPSULE | Freq: Four times a day (QID) | ORAL | Status: DC | PRN

## 2019-08-13 MED ORDER — IRBESARTAN 150 MG PO TABS
150.0000 mg | ORAL_TABLET | Freq: Every day | ORAL | Status: DC
  Filled 2019-08-13: qty 1

## 2019-08-13 NOTE — ED Triage Notes (Signed)
Patient states she was bit by an insect right breast and left wrist. Both are red and edematous. Patient c/o headache also.

## 2019-08-13 NOTE — H&P (Signed)
History and Physical    CAM HARNDEN KKX:[id number] DOB: May 29, 1960 DOA: 08/13/2019  PCP: Eber Hong, MD  Patient coming from: Home, fianc at bedside  I have personally briefly reviewed patient's old medical records in [place]  Chief Complaint: Rash on left wrist and right breast  HPI: Sharon Carson is a 59 y.o. female with medical history significant for Hx of HTN, Asthma, OSA, GERD, IBS, Type 2 diabetes, CKD stage 3a who presents with concerns of a new rash on her left wrist and right breast area.  She first noted the rash yesterday initially it was small paretic but she began to note her left rash streaking up her left arm as well as the rash enlarging on her right breast.  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  Otherwise she denies any new detergents, soaps,, fragrance.  No new foods.  No fevers.  No symptoms of dysphagia, shortness of breath or angioedema.   In the ED, she was afebrile and normotensive on room air. Leukocytosis of 10.9, BMP otherwise with glucose of 298 with no anion gap.  Ultrasound was done of the left wrist and right breast and no abscesses were seen by ED physician.  Fungus culture obtained in ED since pt was working with Lorretta Harp and ED physician had concerns of sporotrichosis.  She was given IV Benadryl and started on IV Clindamycin.    Review of System:  Constitutional: No Weight Change, No Fever ENT/Mouth: No sore throat, No Rhinorrhea Eyes: No Eye Pain, No Vision Changes Cardiovascular: No Chest Pain, no SOB Respiratory: No Cough, No Sputum Gastrointestinal: No Nausea, No Vomiting, No Diarrhea Genitourinary: no Urinary Incontinence Musculoskeletal: No Arthralgias, No Myalgias Skin: No Skin Lesions, No Pruritus, Neuro: no Weakness, No Numbness Psych: No Anxiety/Panic, No Depression, no decrease appetite Heme/Lymph: No Bruising, No Bleeding  Past Medical  History:  Diagnosis Date  . Asthma   . CKD (chronic kidney disease), stage III   . Depression   . Diabetes mellitus, type II ([place])   . GERD (gastroesophageal reflux disease)   . Hypertension   . Hypokalemia   . Hypothyroidism   . IBS (irritable bowel syndrome)   . Sleep apnea     Past Surgical History:  Procedure Laterality Date  . ABDOMINAL HYSTERECTOMY    . BACK SURGERY    . CESAREAN SECTION    . RETINAL TEAR REPAIR CRYOTHERAPY    . rotar cuff repair Right      reports that she has never smoked. She has never used smokeless tobacco. She reports that she does not drink alcohol or use drugs.  Allergies  Allergen Reactions  . Hydrochlorothiazide Other (See Comments)    Other reaction(s): itching Renal insufficiency Renal insufficiency   . Sulfamethoxazole-Trimethoprim Itching and Rash    Other reaction(s): Itching   . Amlodipine Besylate Other (See Comments)    angioedema    Family History  Problem Relation Age of Onset  . Thyroid disease Mother   . Hypertension Mother   . Hypertension Father      Prior to Admission medications   Medication Sig Start Date End Date Taking? Authorizing Provider  glimepiride (AMARYL) 1 MG tablet Take 0.5 mg by mouth daily. 08/08/19  Yes [provider]  irbesartan (AVAPRO) 150 MG tablet Take 150 mg by mouth daily.   Yes [provider]  levothyroxine (SYNTHROID, LEVOTHROID) 88 MCG tablet Take  88 mcg by mouth daily before breakfast.   Yes [provider]  metFORMIN (GLUCOPHAGE) 1000 MG tablet Take 1,000 mg by mouth 2 (two) times daily with a meal.   Yes [provider]  Multiple Vitamin (MULTIVITAMIN WITH MINERALS) TABS tablet Take 1 tablet by mouth daily.   Yes [provider]  Blood Glucose Monitoring Suppl (TRUE METRIX METER) w/Device KIT Test BG qid. E11.65 06/02/17   Cassandria Anger, MD  glucose blood (TRUE METRIX BLOOD GLUCOSE TEST) test strip Use as instructed 4 x daily. E11.65  06/02/17   Cassandria Anger, MD  Insulin Pen Needle (B-D ULTRAFINE III SHORT PEN) 31G X 8 MM MISC 1 each by Does not apply route as directed. 06/01/17   Cassandria Anger, MD  Lancets MISC 1 each by Does not apply route as directed. 06/01/17   Cassandria Anger, MD  Lancets MISC 1 each by Does not apply route 4 (four) times daily. 06/02/17   Cassandria Anger, MD    Physical Exam: Vitals:   08/13/19 1738 08/13/19 1740 08/13/19 2152  BP: (!) 136/91  131/88  Pulse: 95  80  Resp: 18  20  Temp: 98.4 F (36.9 C)    TempSrc: Oral    SpO2: 96%  97%  Weight:  111.1 kg   Height:  '5\' 4"'$  (1.626 m)     Constitutional: NAD, calm, comfortable, nontoxic appearing female laying at 40 degree incline in bed Vitals:   08/13/19 1738 08/13/19 1740 08/13/19 2152  BP: (!) 136/91  131/88  Pulse: 95  80  Resp: 18  20  Temp: 98.4 F (36.9 C)    TempSrc: Oral    SpO2: 96%  97%  Weight:  111.1 kg   Height:  '5\' 4"'$  (1.626 m)    Eyes: PERRL, lids and conjunctivae normal ENMT: Mucous membranes are moist.  Neck: normal, supple Respiratory: clear to auscultation bilaterally, no wheezing, no crackles. Normal respiratory effort on room air. Cardiovascular: Regular rate and rhythm, no murmurs / rubs / gallops. No extremity edema. 2+ pedal pulses.  Abdomen: no tenderness, no masses palpated.Bowel sounds positive.  Musculoskeletal: no clubbing / cyanosis. No joint deformity upper and lower extremities. Good ROM, no contractures. Normal muscle tone.  Skin: Erythematous, edematous rash with induration felt subcutaneously on left wrist with streaking up left anterior arm.  Similar spreading rashes noted above the right breast.  Neurologic: CN 2-12 grossly intact. Sensation intact. Strength 5/5 in all 4.  Psychiatric: Normal judgment and insight. Alert and oriented x 3. Normal mood.         Labs on Admission: I have personally reviewed following labs and imaging studies  CBC: Recent Labs   Lab 08/13/19 2139  WBC 10.9*  NEUTROABS 5.1  HGB 14.2  HCT 42.9  MCV 87.4  PLT 974   Basic Metabolic Panel: Recent Labs  Lab 08/13/19 2139  NA 136  K 3.7  CL 105  CO2 20*  GLUCOSE 298*  BUN 16  CREATININE 0.84  CALCIUM 9.0   GFR: Estimated Creatinine Clearance: 91.3 mL/min (by C-G formula based on SCr of 0.84 mg/dL). Liver Function Tests: No results for input(s): AST, ALT, ALKPHOS, BILITOT, PROT, ALBUMIN in the last 168 hours. No results for input(s): LIPASE, AMYLASE in the last 168 hours. No results for input(s): AMMONIA in the last 168 hours. Coagulation Profile: No results for input(s): INR, PROTIME in the last 168 hours. Cardiac Enzymes: No results for input(s): CKTOTAL, CKMB, CKMBINDEX,  TROPONINI in the last 168 hours. BNP (last 3 results) No results for input(s): PROBNP in the last 8760 hours. HbA1C: No results for input(s): HGBA1C in the last 72 hours. CBG: No results for input(s): GLUCAP in the last 168 hours. Lipid Profile: No results for input(s): CHOL, HDL, LDLCALC, TRIG, CHOLHDL, LDLDIRECT in the last 72 hours. Thyroid Function Tests: No results for input(s): TSH, T4TOTAL, FREET4, T3FREE, THYROIDAB in the last 72 hours. Anemia Panel: No results for input(s): VITAMINB12, FOLATE, FERRITIN, TIBC, IRON, RETICCTPCT in the last 72 hours. Urine analysis: No results found for: COLORURINE, APPEARANCEUR, LABSPEC, PHURINE, GLUCOSEU, HGBUR, BILIRUBINUR, KETONESUR, PROTEINUR, UROBILINOGEN, NITRITE, LEUKOCYTESUR  Radiological Exams on Admission: No results found.    Assessment/Plan Cellulitis/allergic dermatitis of the left wrist/right breast  Given IV Clindamycin in the ED. Will switch to Rocephin since no abscess noted. Will give one time dose of steroids as well as this appears to be both allergic and infectious.Can evaluate need to continue in the morning.  Fungus culture pending since pt was working with Lorretta Harp and ED physician had concerns of  sporotrichosis. PRN oral Benadryl for puritus   Type 2 diabetes Previously uncontrolled with last hemoglobin A1c in 2019 noted to be 10.9.  Will obtain hemoglobin A1c. Placed on moderate sliding scale  Hypertension Continue irbesartan  Asthma Not in any acute exacerbation.  CKD stage IIIa Stable.  Avoid nephrotoxic agent.  Hypothyroidism Continue levothyroxine  Morbid obesity BMI of greater than 42  DVT prophylaxis:.Lovenox Code Status: Full Family Communication: Plan discussed with patient and her fianc at bedside  disposition Plan: Home with at least 2 midnight stays  Consults called:  Admission status: inpatient  Status is: Inpatient  Remains inpatient appropriate because:IV treatments appropriate due to intensity of illness or inability to take PO   Dispo: The patient is from: Home              Anticipated d/c is to: Home              Anticipated d/c date is: 3 days              Patient currently is not medically stable to d/c.          Orene Desanctis DO [facility]   If 7PM-7AM, please contact night-coverage [email or web address]   08/13/2019, 11:43 PM

## 2019-08-13 NOTE — ED Provider Notes (Signed)
[facility] DEPT Provider Note   CSN: [id number] Arrival date & time: 08/13/19  1702     History Chief Complaint  Patient presents with  . Insect Bite  . Headache    Sharon Carson is a 59 y.o. female with a past medical history significant for asthma, CKD stage III, diabetes mellitus, GERD, hypertension, hypothyroidism, and IBS who presents to the ED due to possible insect bites to her left forearm and right breast that is progressively gotten worse over the past 24 hours.  Patient notes she was working in a garden on Saturday and noticed insect bites on her left arm and right breast yesterday. She admits she was gardening with roses on Saturday. Today areas became erythematous with edema. Notes insect bites are painful and itchy. Denies fever and chills. Denies history of IV drug use. She has tried OTC hydrocortisone cream with no relief. Denies decreased ROM of left wrist. Notes areas are tender to touch. Denies difficulties breathing, angioedema, and feelings of her throat closing.  Patient also admits to a bilateral frontal headache. She has a history of migraine headaches and notes this feels similar to her past headaches. She has not tried anything for pain. Denies sudden onset and worst intensity at onset. Denies visual changes, speech changes, unilateral weakness, numbness/tingling, nausea, and vomiting.   History obtained from patient and past medical records. No interpreter used during encounter.      Past Medical History:  Diagnosis Date  . Asthma   . CKD (chronic kidney disease), stage III   . Depression   . Diabetes mellitus, type II ([place])   . GERD (gastroesophageal reflux disease)   . Hypertension   . Hypokalemia   . Hypothyroidism   . IBS (irritable bowel syndrome)   . Sleep apnea     Patient Active Problem List   Diagnosis Date Noted  . Cellulitis 08/13/2019  . Uncontrolled type 2 diabetes mellitus with hyperglycemia ([place])  06/01/2017  . Essential hypertension, benign 06/01/2017  . Morbid obesity ([place]) 06/01/2017  . Acquired hypothyroidism 06/01/2017    Past Surgical History:  Procedure Laterality Date  . ABDOMINAL HYSTERECTOMY    . BACK SURGERY    . CESAREAN SECTION    . RETINAL TEAR REPAIR CRYOTHERAPY    . rotar cuff repair Right      OB History   No obstetric history on file.     Family History  Problem Relation Age of Onset  . Thyroid disease Mother   . Hypertension Mother   . Hypertension Father     Social History   Tobacco Use  . Smoking status: Never Smoker  . Smokeless tobacco: Never Used  Substance Use Topics  . Alcohol use: No  . Drug use: No    Home Medications Prior to Admission medications   Medication Sig Start Date End Date Taking? Authorizing Provider  glimepiride (AMARYL) 1 MG tablet Take 0.5 mg by mouth daily. 08/08/19  Yes [provider]  irbesartan (AVAPRO) 150 MG tablet Take 150 mg by mouth daily.   Yes [provider]  levothyroxine (SYNTHROID, LEVOTHROID) 88 MCG tablet Take 88 mcg by mouth daily before breakfast.   Yes [provider]  metFORMIN (GLUCOPHAGE) 1000 MG tablet Take 1,000 mg by mouth 2 (two) times daily with a meal.   Yes [provider]  Multiple Vitamin (MULTIVITAMIN WITH MINERALS) TABS tablet Take 1 tablet by mouth daily.   Yes [provider]  Blood Glucose Monitoring  Suppl (TRUE METRIX METER) w/Device KIT Test BG qid. E11.65 06/02/17   Cassandria Anger, MD  glucose blood (TRUE METRIX BLOOD GLUCOSE TEST) test strip Use as instructed 4 x daily. E11.65 06/02/17   Cassandria Anger, MD  Insulin Pen Needle (B-D ULTRAFINE III SHORT PEN) 31G X 8 MM MISC 1 each by Does not apply route as directed. 06/01/17   Cassandria Anger, MD  Lancets MISC 1 each by Does not apply route as directed. 06/01/17   Cassandria Anger, MD  Lancets MISC 1 each by Does not apply route 4 (four) times daily. 06/02/17   Cassandria Anger, MD    Allergies    Hydrochlorothiazide, Sulfamethoxazole-trimethoprim, and Amlodipine besylate  Review of Systems   Review of Systems  Constitutional: Negative for chills and fever.  Respiratory: Negative for shortness of breath.   Cardiovascular: Negative for chest pain.  Skin: Positive for color change and wound.  Neurological: Positive for headaches. Negative for dizziness, facial asymmetry, weakness and numbness.  All other systems reviewed and are negative.   Physical Exam Updated Vital Signs BP 131/88 (BP Location: Right Arm)   Pulse 80   Temp 98.4 F (36.9 C) (Oral)   Resp 20   Ht '5\' 4"'$  (1.626 m)   Wt 111.1 kg   SpO2 97%   BMI 42.05 kg/m   Physical Exam Vitals and nursing note reviewed.  Constitutional:      General: She is not in acute distress.    Appearance: She is not ill-appearing.  HENT:     Head: Normocephalic.  Eyes:     Extraocular Movements: Extraocular movements intact.     Pupils: Pupils are equal, round, and reactive to light.  Cardiovascular:     Rate and Rhythm: Normal rate and regular rhythm.     Pulses: Normal pulses.     Heart sounds: Normal heart sounds. No murmur. No friction rub. No gallop.   Pulmonary:     Effort: Pulmonary effort is normal.     Breath sounds: Normal breath sounds.  Abdominal:     General: Abdomen is flat. Bowel sounds are normal. There is no distension.     Palpations: Abdomen is soft.     Tenderness: There is no abdominal tenderness. There is no guarding or rebound.  Musculoskeletal:     Cervical back: Neck supple.     Comments: Left distal forearm erythematous and edematous with streaking up forearm. Full ROM of left wrist. Radial pulse intact. Full ROM of left elbow. Soft compartments.   Skin:    Comments: Left distal forearm erythematous and edematous with streaking up forearm. Area of erythema over right breast with central induration.   Neurological:     General: No focal deficit present.      Mental Status: She is alert.     Comments: Speech is clear, able to follow commands CN III-XII intact Normal strength in upper and lower extremities bilaterally including dorsiflexion and plantar flexion, strong and equal grip strength Sensation grossly intact throughout Moves extremities without ataxia, coordination intact No pronator drift Ambulates without difficulty  Psychiatric:        Mood and Affect: Mood normal.        Behavior: Behavior normal.           ED Results / Procedures / Treatments   Labs (all labs ordered are listed, but only abnormal results are displayed) Labs Reviewed  CBC WITH DIFFERENTIAL/PLATELET - Abnormal; Notable for the following components:  Result Value   WBC 10.9 (*)    Lymphs Abs 4.9 (*)    All other components within normal limits  BASIC METABOLIC PANEL - Abnormal; Notable for the following components:   CO2 20 (*)    Glucose, Bld 298 (*)    All other components within normal limits  CULTURE, BLOOD (ROUTINE X 2)  CULTURE, BLOOD (ROUTINE X 2)  FUNGUS CULTURE, BLOOD  SARS CORONAVIRUS 2 BY RT PCR (HOSPITAL ORDER, [facility])  LACTIC ACID, PLASMA  HIV ANTIBODY (ROUTINE TESTING W REFLEX)  BASIC METABOLIC PANEL  CBC  HEMOGLOBIN A1C    EKG None  Radiology No results found.  Procedures Ultrasound ED Soft Tissue  Date/Time: 08/13/2019 11:44 PM Performed by: Suzy Bouchard, PA-C Authorized by: Suzy Bouchard, PA-C   Procedure details:    Indications: localization of abscess and evaluate for cellulitis     Transverse view:  Visualized   Longitudinal view:  Visualized   Images: archived     Limitations:  Positioning Location:    Location: upper extremity     Side:  Left Findings:     no abscess present    cellulitis present    no foreign body present   (including critical care time)  Medications Ordered in ED Medications  enoxaparin (LOVENOX) injection 40 mg (has no administration  in time range)  diphenhydrAMINE (BENADRYL) capsule 25 mg (has no administration in time range)  cefTRIAXone (ROCEPHIN) 2 g in sodium chloride 0.9 % 100 mL IVPB (has no administration in time range)  irbesartan (AVAPRO) tablet 150 mg (has no administration in time range)  levothyroxine (SYNTHROID) tablet 88 mcg (has no administration in time range)  insulin aspart (novoLOG) injection 0-15 Units (has no administration in time range)  insulin aspart (novoLOG) injection 0-5 Units (has no administration in time range)  prochlorperazine (COMPAZINE) injection 10 mg (10 mg Intravenous Given 08/13/19 2153)  diphenhydrAMINE (BENADRYL) injection 12.5 mg (12.5 mg Intravenous Given 08/13/19 2153)  clindamycin (CLEOCIN) IVPB 600 mg (0 mg Intravenous Stopped 08/13/19 2223)    ED Course  I have reviewed the triage vital signs and the nursing notes.  Pertinent labs & imaging results that were available during my care of the patient were reviewed by me and considered in my medical decision making (see chart for details).  Clinical Course as of Aug 12 2344  Mon Aug 13, 2019  2226 WBC(!): 10.9 [CA]  2239 Glucose(!): 298 [CA]    Clinical Course User Index [CA] Suzy Bouchard, PA-C   MDM Rules/Calculators/A&P                     59 year old female presents to the ED due to possible "insect bites" to left forearm and right breast.  Patient notes over the past 24 hours her left forearm and right breast have become extremely erythematous with streaking up her left arm.  Denies fever and chills.  Denies IV drug use.  On arrival, patient is afebrile, not tachycardic or hypoxic.  Patient in no acute distress and nontoxic-appearing. eythematous left distal forearm with streaking up left arm. Area of erythema over right breast. See photos above. Full ROM of left wrist. Low suspicion for septic arthritis. Performed bedside ultrasound which was negative for abscess formation, but notable for cobblestoning. Unsure if  erythema due to cellulitis vs. Allergic reaction. Will obtain routine labs, blood cultures, and lactic acid given the streaking up left arm. Will start IV clindamycin.  Will treat headache with migraine cocktail, but hold toradol given CKD. Normal neurological exam. No concern for [facility] or emergent intracranial etiologies. Given patient was dealing with roses, sporotrichosis was considered. Will obtain fungal blood culture. Patient will most likely need medical admission given streaking up left forearm within 24 hours.   CBC significant for leukocytosis at 10.9, but otherwise reassuring. BMP significant for hyperglycemia at 298 with no anion gap. No concern for DKA. Lactic acid at 1.5.   10:46 PM reassessed patient at bedside. Patient notes pruritis has improved. She also notes headache has improved after migraine cocktail. Erythema of left forearm appears to have spread outside the borders.   Will consult hospitalist for admission given rapid spreading and need for IV antibiotics. Discussed case with Dr. Flossie Buffy who agrees to admit patient for further treatment. COVID test pending.  Discussed case with Dr. Sherry Ruffing who agrees with assessment and plan.  Final Clinical Impression(s) / ED Diagnoses Final diagnoses:  Cellulitis of left upper extremity    Rx / DC Orders ED Discharge Orders    None       Karie Kirks 08/13/19 2347    Tegeler, Gwenyth Allegra, MD 08/14/19 0003

## 2019-08-14 DIAGNOSIS — E1165 Type 2 diabetes mellitus with hyperglycemia: Secondary | ICD-10-CM

## 2019-08-14 DIAGNOSIS — Z8709 Personal history of other diseases of the respiratory system: Secondary | ICD-10-CM | POA: Diagnosis not present

## 2019-08-14 DIAGNOSIS — I10 Essential (primary) hypertension: Secondary | ICD-10-CM

## 2019-08-14 DIAGNOSIS — L03114 Cellulitis of left upper limb: Secondary | ICD-10-CM

## 2019-08-14 DIAGNOSIS — E039 Hypothyroidism, unspecified: Secondary | ICD-10-CM | POA: Diagnosis not present

## 2019-08-14 LAB — HEMOGLOBIN A1C
Hgb A1c MFr Bld: 11.4 % — ABNORMAL HIGH (ref 4.8–5.6)
Mean Plasma Glucose: 280.48 mg/dL

## 2019-08-14 LAB — GLUCOSE, CAPILLARY
Glucose-Capillary: 355 mg/dL — ABNORMAL HIGH (ref 70–99)
Glucose-Capillary: 415 mg/dL — ABNORMAL HIGH (ref 70–99)
Glucose-Capillary: 352 mg/dL — ABNORMAL HIGH (ref 70–99)

## 2019-08-14 LAB — BASIC METABOLIC PANEL
Anion gap: 11 (ref 5–15)
BUN: 16 mg/dL (ref 6–20)
CO2: 21 mmol/L — ABNORMAL LOW (ref 22–32)
Calcium: 9.1 mg/dL (ref 8.9–10.3)
Chloride: 104 mmol/L (ref 98–111)
Creatinine, Ser: 1.09 mg/dL — ABNORMAL HIGH (ref 0.44–1.00)
GFR calc Af Amer: >60 mL/min (ref >60)
GFR calc non Af Amer: 57 mL/min — ABNORMAL LOW (ref >60)
Glucose, Bld: 400 mg/dL — ABNORMAL HIGH (ref 70–99)
Potassium: 3.6 mmol/L (ref 3.5–5.1)
Sodium: 136 mmol/L (ref 135–145)

## 2019-08-14 LAB — GLUCOSE, RANDOM: Glucose, Bld: 460 mg/dL — ABNORMAL HIGH (ref 70–99)

## 2019-08-14 LAB — CBC
HCT: 41.3 % (ref 36.0–46.0)
Hemoglobin: 13.4 g/dL (ref 12.0–15.0)
MCH: 28.9 pg (ref 26.0–34.0)
MCHC: 32.4 g/dL (ref 30.0–36.0)
MCV: 89 fL (ref 80.0–100.0)
Platelets: 289 10*3/uL (ref 150–400)
RBC: 4.64 MIL/uL (ref 3.87–5.11)
RDW: 13.7 % (ref 11.5–15.5)
WBC: 10.4 10*3/uL (ref 4.0–10.5)
nRBC: 0 % (ref 0.0–0.2)

## 2019-08-14 LAB — SARS CORONAVIRUS 2 BY RT PCR (HOSPITAL ORDER, PERFORMED IN ~~LOC~~ HOSPITAL LAB): SARS Coronavirus 2: NEGATIVE

## 2019-08-14 MED ORDER — INSULIN ASPART 100 UNIT/ML ~~LOC~~ SOLN
10.0000 [IU] | Freq: Once | SUBCUTANEOUS | Status: AC
  Administered 2019-08-14: 10 [IU] via SUBCUTANEOUS

## 2019-08-14 MED ORDER — SENNOSIDES-DOCUSATE SODIUM 8.6-50 MG PO TABS: 2.0000 | ORAL_TABLET | Freq: Every evening | ORAL | Status: DC | PRN

## 2019-08-14 MED ORDER — POLYETHYLENE GLYCOL 3350 17 G PO PACK: 17.0000 g | PACK | Freq: Every day | ORAL | Status: DC | PRN

## 2019-08-14 MED ORDER — PREDNISONE 10 MG PO TABS
20.0000 mg | ORAL_TABLET | Freq: Every day | ORAL | 0 refills | Status: AC
Start: 2019-08-14 — End: 2019-08-19

## 2019-08-14 MED ORDER — GLIMEPIRIDE 1 MG PO TABS
0.5000 mg | ORAL_TABLET | Freq: Every day | ORAL | Status: DC
  Administered 2019-08-14: 0.5 mg via ORAL
  Filled 2019-08-14 (×2): qty 0.5

## 2019-08-14 MED ORDER — SODIUM CHLORIDE 0.9 % IV SOLN: INTRAVENOUS | Status: DC

## 2019-08-14 MED ORDER — CEPHALEXIN 500 MG PO CAPS: 500.0000 mg | ORAL_CAPSULE | Freq: Four times a day (QID) | ORAL | 0 refills | Status: AC

## 2019-08-14 MED ORDER — METFORMIN HCL 500 MG PO TABS
1000.0000 mg | ORAL_TABLET | Freq: Two times a day (BID) | ORAL | Status: DC
  Administered 2019-08-14: 1000 mg via ORAL
  Filled 2019-08-14: qty 2

## 2019-08-14 MED ORDER — SODIUM CHLORIDE 0.9 % IV SOLN: 2.0000 g | INTRAVENOUS | Status: DC

## 2019-08-14 MED ORDER — INSULIN GLARGINE 100 UNIT/ML ~~LOC~~ SOLN
15.0000 [IU] | Freq: Every day | SUBCUTANEOUS | Status: DC
  Administered 2019-08-14: 15 [IU] via SUBCUTANEOUS
  Filled 2019-08-14: qty 0.15

## 2019-08-14 MED ORDER — INSULIN ASPART 100 UNIT/ML ~~LOC~~ SOLN
3.0000 [IU] | Freq: Three times a day (TID) | SUBCUTANEOUS | Status: DC
  Administered 2019-08-14 (×2): 3 [IU] via SUBCUTANEOUS

## 2019-08-14 NOTE — Discharge Summary (Signed)
Physician Discharge Summary  ALANAH SAKUMA DGU:[id number] DOB: October 23, 1960 DOA: 08/13/2019  PCP: Eber Hong, MD  Admit date: 08/13/2019 Discharge date: 08/14/2019  Admitted From: Home Disposition: Home Home  Recommendations for Outpatient Follow-up:  1. Follow up with PCP in 1-2 weeks 2. Please obtain BMP/CBC in one week your next doctors visit.  3. Oral Keflex for 7 days has been prescribed  4. Oral prednisone for 5 days 5. Would recommend outpatient follow-up with allergy immunology specialist.  Home Health: None Equipment/Devices: None Discharge Condition: Stable CODE STATUS: Full Diet recommendation: Diabetic  Brief/Interim Summary:  59 year old with HTN, asthma, IBS, GERD, DM2, CKD stage IIIb presented with erythema of the left wrist and right breast area.  She noted progression of this rash.  Apparently patient was working with hospice at home.  Physician became concerned sporotrichosis.  Started on IV Benadryl and clindamycin.  Upon admission she was transitioned to IV Rocephin.  She responded well to this, her erythema improved significantly.  She was also given dose of steroids.  She only reported of itching.  Medically stable to be discharged with recommendations above.   Assessment & Plan:   Principal Problem:   Cellulitis Active Problems:   Uncontrolled type 2 diabetes mellitus with hyperglycemia (HCC)   Essential hypertension, benign   Morbid obesity (HCC)   Acquired hypothyroidism   History of asthma  Erythema/cellulitis of left wrist and right breast pruritus, significant improvement Possible allergic dermatitis We will transition from Rocephin to Keflex for 7 more days.  We will also give short course of prednisone 20 mg for 5 days Would benefit from outpatient follow-up with PCP and possible referral to allergy/immunology.  Diabetes mellitus type 2, uncontrolled due to hyperglycemia Hemoglobin A1c-11.4 Resume home meds.  I expect some hyperglycemia  while being on prednisone.  I have advised her to check her blood glucose very closely at home.  If remains significantly high notify her PCP. Rest of the adjustment as outpatient PCP desires  Essential hypertension -Continue irbesartan  Asthma As needed bronchodilators  CKD stage II Baseline Cr around 1.0. Stable continue to monitor  Hypothyroidism Synthroid  Morbid obesity with BMI greater than 42 Weight loss diet and exercise.  Discharge Diagnoses:  Principal Problem:   Cellulitis Active Problems:   Uncontrolled type 2 diabetes mellitus with hyperglycemia (HCC)   Essential hypertension, benign   Morbid obesity ([place])   Acquired hypothyroidism   History of asthma    Consultations:  None  Subjective: Feels great.  Reports of some itching but erythema on her left wrist/arm and her right breast has significantly improved from the marking last night.  Discharge Exam: Vitals:   08/14/19 0050 08/14/19 0544  BP: 117/68 122/81  Pulse: 86 88  Resp: 18 16  Temp: 97.6 F (36.4 C) 97.9 F (36.6 C)  SpO2: 100% 96%   Vitals:   08/13/19 2152 08/14/19 0025 08/14/19 0050 08/14/19 0544  BP: 131/88  117/68 122/81  Pulse: 80 84 86 88  Resp: '20  18 16  '$ Temp:   97.6 F (36.4 C) 97.9 F (36.6 C)  TempSrc:   Oral Oral  SpO2: 97% 95% 100% 96%  Weight:      Height:        General: Pt is alert, awake, not in acute distress Cardiovascular: RRR, S1/S2 +, no rubs, no gallops Respiratory: CTA bilaterally, no wheezing, no rhonchi Abdominal: Soft, NT, ND, bowel sounds + Extremities: no edema, no cyanosis Mild swelling and erythema of her left  upper extremity greatly improved.  Mild erythema for outer quadrant of the right breast.  Both of which have significantly improved from demarcated line yesterday.  Discharge Instructions   Allergies as of 08/14/2019      Reactions   Hydrochlorothiazide Other (See Comments)   Other reaction(s): itching Renal insufficiency Renal  insufficiency   Sulfamethoxazole-trimethoprim Itching, Rash   Other reaction(s): Itching   Amlodipine Besylate Other (See Comments)   angioedema      Medication List    TAKE these medications   cephALEXin 500 MG capsule Commonly known as: KEFLEX Take 1 capsule (500 mg total) by mouth 4 (four) times daily for 7 days.   glimepiride 1 MG tablet Commonly known as: AMARYL Take 0.5 mg by mouth daily.   glucose blood test strip Commonly known as: True Metrix Blood Glucose Test Use as instructed 4 x daily. E11.65   Insulin Pen Needle 31G X 8 MM Misc Commonly known as: B-D ULTRAFINE III SHORT PEN 1 each by Does not apply route as directed.   irbesartan 150 MG tablet Commonly known as: AVAPRO Take 150 mg by mouth daily.   Lancets Misc 1 each by Does not apply route as directed.   Lancets Misc 1 each by Does not apply route 4 (four) times daily.   levothyroxine 88 MCG tablet Commonly known as: SYNTHROID Take 88 mcg by mouth daily before breakfast.   metFORMIN 1000 MG tablet Commonly known as: GLUCOPHAGE Take 1,000 mg by mouth 2 (two) times daily with a meal.   multivitamin with minerals Tabs tablet Take 1 tablet by mouth daily.   predniSONE 10 MG tablet Commonly known as: DELTASONE Take 2 tablets (20 mg total) by mouth daily with breakfast for 5 days.   True Metrix Meter w/Device Kit Test BG qid. E11.65      Follow-up Information    Eber Hong, MD. Schedule an appointment as soon as possible for a visit in 1 week(s).   Specialty: Internal Medicine Contact information: [address] [place] [address] [phone]          Allergies  Allergen Reactions  . Hydrochlorothiazide Other (See Comments)    Other reaction(s): itching Renal insufficiency Renal insufficiency   . Sulfamethoxazole-Trimethoprim Itching and Rash    Other reaction(s): Itching   . Amlodipine Besylate Other (See Comments)    angioedema    You were cared for by a  hospitalist during your hospital stay. If you have any questions about your discharge medications or the care you received while you were in the hospital after you are discharged, you can call the unit and asked to speak with the hospitalist on call if the hospitalist that took care of you is not available. Once you are discharged, your primary care physician will handle any further medical issues. Please note that no refills for any discharge medications will be authorized once you are discharged, as it is imperative that you return to your primary care physician (or establish a relationship with a primary care physician if you do not have one) for your aftercare needs so that they can reassess your need for medications and monitor your lab values.   Procedures/Studies:  No results found.   The results of significant diagnostics from this hospitalization (including imaging, microbiology, ancillary and laboratory) are listed below for reference.     Microbiology: Recent Results (from the past 240 hour(s))  Blood culture (routine x 2)     Status: None (Preliminary result)   Collection  Time: 08/13/19  8:51 PM   Specimen: BLOOD  Result Value Ref Range Status   Specimen Description   Final    BLOOD BLOOD RIGHT FOREARM Performed at [facility] [address] [place], [place] [address]    Special Requests   Final    BOTTLES DRAWN AEROBIC AND ANAEROBIC Blood Culture results may not be optimal due to an inadequate volume of blood received in culture bottles Performed at [facility] [address], [place], [place] [address]    Culture   Final    NO GROWTH < 12 HOURS Performed at [facility] [address], [place], [place] [address]    Report Status PENDING  Incomplete  Blood culture (routine x 2)     Status: None (Preliminary result)   Collection Time: 08/13/19  8:56 PM   Specimen: BLOOD  Result Value Ref Range Status   Specimen  Description   Final    BLOOD RIGHT ANTECUBITAL Performed at [facility] [address], [place], [place] [address]    Special Requests   Final    BOTTLES DRAWN AEROBIC AND ANAEROBIC Blood Culture results may not be optimal due to an excessive volume of blood received in culture bottles Performed at [facility] [address] [place], [place] [address]    Culture   Final    NO GROWTH < 12 HOURS Performed at [facility] [address], [place], [place] [address]    Report Status PENDING  Incomplete  Fungus culture, blood     Status: None (Preliminary result)   Collection Time: 08/13/19  9:39 PM   Specimen: BLOOD  Result Value Ref Range Status   Specimen Description   Final    BLOOD BLOOD RIGHT FOREARM Performed at [facility] [address], [place], [place] [address]    Special Requests   Final    BOTTLES DRAWN AEROBIC ONLY Blood Culture adequate volume Performed at [facility] [address] [place], [place] [address]    Culture   Final    NO GROWTH < 12 HOURS Performed at [facility] [address] [place] [address], [place],  [address]    Report Status PENDING  Incomplete  SARS Coronavirus 2 by RT PCR (hospital order, performed in [facility] hospital lab) Nasopharyngeal Nasopharyngeal Swab     Status: None   Collection Time: 08/13/19 11:16 PM   Specimen: Nasopharyngeal Swab  Result Value Ref Range Status   SARS Coronavirus 2 NEGATIVE NEGATIVE Final    Comment: (NOTE) SARS-CoV-2 target nucleic acids are NOT DETECTED. The SARS-CoV-2 RNA is generally detectable in upper and lower respiratory specimens during the acute phase of infection. The lowest concentration of SARS-CoV-2 viral copies this assay can detect is 250 copies / mL. A negative result does not preclude SARS-CoV-2 infection and should not be used as the sole basis for treatment or other patient management decisions.  A  negative result may occur with improper specimen collection / handling, submission of specimen other than nasopharyngeal swab, presence of viral mutation(s) within the areas targeted by this assay, and inadequate number of viral copies (<250 copies / mL). A negative result must be combined with clinical observations, patient history, and epidemiological information. Fact Sheet for Patients:   StrictlyIdeas.no Fact Sheet for Healthcare Providers: BankingDealers.co.za This test is not yet approved or cleared  by the [place] FDA and has been authorized for detection  and/or diagnosis of SARS-CoV-2 by FDA under an Emergency Use Authorization (EUA).  This EUA will remain in effect (meaning this test can be used) for the duration of the COVID-19 declaration under Section 564(b)(1) of the Act, 21 U.S.C. section 360bbb-3(b)(1), unless the authorization is terminated or revoked sooner. Performed at [facility], [place] [address] [place], [place] [address]      Labs: BNP (last 3 results) No results for input(s): BNP in the last 8760 hours. Basic Metabolic Panel: Recent Labs  Lab 08/13/19 2139 08/14/19 0129 08/14/19 0808  NA 136 136  --   K 3.7 3.6  --   CL 105 104  --   CO2 20* 21*  --   GLUCOSE 298* 400* 460*  BUN 16 16  --   CREATININE 0.84 1.09*  --   CALCIUM 9.0 9.1  --    Liver Function Tests: No results for input(s): AST, ALT, ALKPHOS, BILITOT, PROT, ALBUMIN in the last 168 hours. No results for input(s): LIPASE, AMYLASE in the last 168 hours. No results for input(s): AMMONIA in the last 168 hours. CBC: Recent Labs  Lab 08/13/19 2139 08/14/19 0129  WBC 10.9* 10.4  NEUTROABS 5.1  --   HGB 14.2 13.4  HCT 42.9 41.3  MCV 87.4 89.0  PLT 342 289   Cardiac Enzymes: No results for input(s): CKTOTAL, CKMB, CKMBINDEX, TROPONINI in the last 168 hours. BNP: Invalid input(s): POCBNP CBG: Recent Labs  Lab  08/14/19 0130 08/14/19 0746  GLUCAP 355* 415*   D-Dimer No results for input(s): DDIMER in the last 72 hours. Hgb A1c Recent Labs    08/14/19 0129  HGBA1C 11.4*   Lipid Profile No results for input(s): CHOL, HDL, LDLCALC, TRIG, CHOLHDL, LDLDIRECT in the last 72 hours. Thyroid function studies No results for input(s): TSH, T4TOTAL, T3FREE, THYROIDAB in the last 72 hours.  Invalid input(s): FREET3 Anemia work up No results for input(s): VITAMINB12, FOLATE, FERRITIN, TIBC, IRON, RETICCTPCT in the last 72 hours. Urinalysis No results found for: COLORURINE, APPEARANCEUR, [place], [place], [place], [place], [place], [place], PROTEINUR, UROBILINOGEN, NITRITE, LEUKOCYTESUR Sepsis Labs Invalid input(s): PROCALCITONIN,  WBC,  LACTICIDVEN Microbiology Recent Results (from the past 240 hour(s))  Blood culture (routine x 2)     Status: None (Preliminary result)   Collection Time: 08/13/19  8:51 PM   Specimen: BLOOD  Result Value Ref Range Status   Specimen Description   Final    BLOOD BLOOD RIGHT FOREARM Performed at [facility], [place] [address] [place], [place] [address]    Special Requests   Final    BOTTLES DRAWN AEROBIC AND ANAEROBIC Blood Culture results may not be optimal due to an inadequate volume of blood received in culture bottles Performed at [facility] [address], [place], [place] [address]    Culture   Final    NO GROWTH < 12 HOURS Performed at [facility] [address] [place], [place] [address]    Report Status PENDING  Incomplete  Blood culture (routine x 2)     Status: None (Preliminary result)   Collection Time: 08/13/19  8:56 PM   Specimen: BLOOD  Result Value Ref Range Status   Specimen Description   Final    BLOOD RIGHT ANTECUBITAL Performed at [facility] [address], [place], [place] [address]    Special Requests   Final    BOTTLES DRAWN AEROBIC AND ANAEROBIC Blood  Culture results may not be  optimal due to an excessive volume of blood received in culture bottles Performed at [facility] [address], [place], [place] [address]    Culture   Final    NO GROWTH < 12 HOURS Performed at [facility] [address] [place], [place] [address]    Report Status PENDING  Incomplete  Fungus culture, blood     Status: None (Preliminary result)   Collection Time: 08/13/19  9:39 PM   Specimen: BLOOD  Result Value Ref Range Status   Specimen Description   Final    BLOOD BLOOD RIGHT FOREARM Performed at [facility] [address] [place], [place] [address]    Special Requests   Final    BOTTLES DRAWN AEROBIC ONLY Blood Culture adequate volume Performed at [facility] [address] [place], [place] [address]    Culture   Final    NO GROWTH < 12 HOURS Performed at [facility] [address], [place], [place] [address]    Report Status PENDING  Incomplete  SARS Coronavirus 2 by RT PCR (hospital order, performed in [facility] hospital lab) Nasopharyngeal Nasopharyngeal Swab     Status: None   Collection Time: 08/13/19 11:16 PM   Specimen: Nasopharyngeal Swab  Result Value Ref Range Status   SARS Coronavirus 2 NEGATIVE NEGATIVE Final    Comment: (NOTE) SARS-CoV-2 target nucleic acids are NOT DETECTED. The SARS-CoV-2 RNA is generally detectable in upper and lower respiratory specimens during the acute phase of infection. The lowest concentration of SARS-CoV-2 viral copies this assay can detect is 250 copies / mL. A negative result does not preclude SARS-CoV-2 infection and should not be used as the sole basis for treatment or other patient management decisions.  A negative result may occur with improper specimen collection / handling, submission of specimen other than nasopharyngeal swab, presence of viral mutation(s) within the areas targeted by this assay, and inadequate  number of viral copies (<250 copies / mL). A negative result must be combined with clinical observations, patient history, and epidemiological information. Fact Sheet for Patients:   StrictlyIdeas.no Fact Sheet for Healthcare Providers: BankingDealers.co.za This test is not yet approved or cleared  by the [place] FDA and has been authorized for detection and/or diagnosis of SARS-CoV-2 by FDA under an Emergency Use Authorization (EUA).  This EUA will remain in effect (meaning this test can be used) for the duration of the COVID-19 declaration under Section 564(b)(1) of the Act, 21 U.S.C. section 360bbb-3(b)(1), unless the authorization is terminated or revoked sooner. Performed at [facility], [place] [address], [place], [place] [address]      Time coordinating discharge:  I have spent 35 minutes face to face with the patient and on the ward discussing the patients care, assessment, plan and disposition with other care givers. >50% of the time was devoted counseling the patient about the risks and benefits of treatment/Discharge disposition and coordinating care.   SIGNED:   Damita Lack, MD  [facility] 08/14/2019, 10:42 AM   If 7PM-7AM, please contact night-coverage

## 2019-08-14 NOTE — Progress Notes (Signed)
MD notified for CBG of 415. MD instructed RN to give 10 u of novolog at this time with additional coverage orders.

## 2019-08-14 NOTE — Plan of Care (Signed)
Instructions were reviewed with patient. All questions were answered. Patient was transported to main entrance by wheelchair. ° °

## 2019-08-15 LAB — HIV ANTIBODY (ROUTINE TESTING W REFLEX): HIV Screen 4th Generation wRfx: NONREACTIVE

## 2019-08-19 LAB — CULTURE, BLOOD (ROUTINE X 2)
Culture: NO GROWTH
Culture: NO GROWTH

## 2019-08-21 LAB — FUNGUS CULTURE, BLOOD
Culture: NO GROWTH
Special Requests: ADEQUATE

## 2019-10-22 ENCOUNTER — Emergency Department (HOSPITAL_COMMUNITY): Payer: 59

## 2019-10-22 ENCOUNTER — Other Ambulatory Visit: Payer: Self-pay

## 2019-10-22 ENCOUNTER — Emergency Department (HOSPITAL_COMMUNITY)
Admission: EM | Admit: 2019-10-22 | Discharge: 2019-10-22 | Disposition: A | Discharge status: Home / Self Care | Payer: 59 | Attending: Emergency Medicine | Admitting: Emergency Medicine

## 2019-10-22 ENCOUNTER — Encounter (HOSPITAL_COMMUNITY): Payer: Self-pay

## 2019-10-22 DIAGNOSIS — R110 Nausea: Secondary | ICD-10-CM | POA: Diagnosis not present

## 2019-10-22 DIAGNOSIS — R519 Headache, unspecified: Secondary | ICD-10-CM | POA: Insufficient documentation

## 2019-10-22 DIAGNOSIS — R079 Chest pain, unspecified: Secondary | ICD-10-CM | POA: Insufficient documentation

## 2019-10-22 DIAGNOSIS — Z5321 Procedure and treatment not carried out due to patient leaving prior to being seen by health care provider: Secondary | ICD-10-CM | POA: Insufficient documentation

## 2019-10-22 DIAGNOSIS — R109 Unspecified abdominal pain: Secondary | ICD-10-CM | POA: Diagnosis not present

## 2019-10-22 LAB — BASIC METABOLIC PANEL
Anion gap: 9 (ref 5–15)
BUN: 16 mg/dL (ref 6–20)
CO2: 19 mmol/L — ABNORMAL LOW (ref 22–32)
Calcium: 9.1 mg/dL (ref 8.9–10.3)
Chloride: 109 mmol/L (ref 98–111)
Creatinine, Ser: 0.98 mg/dL (ref 0.44–1.00)
GFR calc Af Amer: >60 mL/min (ref >60)
GFR calc non Af Amer: >60 mL/min (ref >60)
Glucose, Bld: 116 mg/dL — ABNORMAL HIGH (ref 70–99)
Potassium: 5.1 mmol/L (ref 3.5–5.1)
Sodium: 137 mmol/L (ref 135–145)

## 2019-10-22 LAB — CBC
HCT: 48.6 % — ABNORMAL HIGH (ref 36.0–46.0)
Hemoglobin: 14.4 g/dL (ref 12.0–15.0)
MCH: 29.5 pg (ref 26.0–34.0)
MCHC: 29.6 g/dL — ABNORMAL LOW (ref 30.0–36.0)
MCV: 99.6 fL (ref 80.0–100.0)
Platelets: 348 10*3/uL (ref 150–400)
RBC: 4.88 MIL/uL (ref 3.87–5.11)
RDW: 13.9 % (ref 11.5–15.5)
WBC: 11.6 10*3/uL — ABNORMAL HIGH (ref 4.0–10.5)
nRBC: 0 % (ref 0.0–0.2)

## 2019-10-22 LAB — TROPONIN I (HIGH SENSITIVITY): Troponin I (High Sensitivity): <2 ng/L (ref <18)

## 2019-10-22 MED ORDER — SODIUM CHLORIDE 0.9% FLUSH: 3.0000 mL | Freq: Once | INTRAVENOUS | Status: DC

## 2019-10-22 NOTE — ED Triage Notes (Signed)
Patient c/o chest pain, headache, abdominal pain, and nausea x a couple of weeks. Patient states she went to [facility] 9 days for the same and has not received her results from blood work.

## 2019-11-12 ENCOUNTER — Other Ambulatory Visit (HOSPITAL_COMMUNITY)
Admission: RE | Admit: 2019-11-12 | Discharge: 2019-11-12 | Disposition: A | Discharge status: Home / Self Care | Payer: 59 | Source: Ambulatory Visit | Attending: Obstetrics | Admitting: Obstetrics

## 2019-11-12 ENCOUNTER — Ambulatory Visit (INDEPENDENT_AMBULATORY_CARE_PROVIDER_SITE_OTHER): Payer: 59 | Admitting: Obstetrics

## 2019-11-12 ENCOUNTER — Encounter: Payer: Self-pay | Admitting: Obstetrics

## 2019-11-12 VITALS — BP 130/86 | HR 90 | Wt 249.9 lb

## 2019-11-12 DIAGNOSIS — N898 Other specified noninflammatory disorders of vagina: Secondary | ICD-10-CM | POA: Insufficient documentation

## 2019-11-12 DIAGNOSIS — Z1239 Encounter for other screening for malignant neoplasm of breast: Secondary | ICD-10-CM | POA: Diagnosis not present

## 2019-11-12 DIAGNOSIS — Z01419 Encounter for gynecological examination (general) (routine) without abnormal findings: Secondary | ICD-10-CM | POA: Diagnosis not present

## 2019-11-12 DIAGNOSIS — Z6841 Body Mass Index (BMI) 40.0 and over, adult: Secondary | ICD-10-CM

## 2019-11-12 LAB — POCT URINALYSIS DIPSTICK
Bilirubin, UA: NEGATIVE
Blood, UA: NEGATIVE
Glucose, UA: POSITIVE — AB
Ketones, UA: NEGATIVE
Leukocytes, UA: NEGATIVE
Nitrite, UA: NEGATIVE
Protein, UA: NEGATIVE
Spec Grav, UA: 1.015 (ref 1.010–1.025)
Urobilinogen, UA: 0.2 E.U./dL
pH, UA: 6 (ref 5.0–8.0)

## 2019-11-12 NOTE — Progress Notes (Addendum)
Subjective:        Sharon Carson is a 59 y.o. female here for a routine exam.  Current complaints: backache and vaginal discharge with irritation. .    Personal health questionnaire:  Is patient Sharon Carson, have a family history of breast and/or ovarian cancer: no Is there a family history of uterine cancer diagnosed at age < 3, gastrointestinal cancer, urinary tract cancer, family member who is a Field seismologist syndrome-associated carrier: no Is the patient overweight and hypertensive, family history of diabetes, personal history of gestational diabetes, preeclampsia or PCOS: yes Is patient over 20, have PCOS,  family history of premature CHD under age 58, diabetes, smoke, have hypertension or peripheral artery disease:  no At any time, has a partner hit, kicked or otherwise hurt or frightened you?: no Over the past 2 weeks, have you felt down, depressed or hopeless?: no Over the past 2 weeks, have you felt little interest or pleasure in doing things?:no   Gynecologic History No LMP recorded. Patient has had a hysterectomy. Contraception: status post hysterectomy Last Pap: unknown. Results were: normal Last mammogram: > 3 yrs. Results were: normal  Obstetric History OB History  No obstetric history on file.    Past Medical History:  Diagnosis Date  . Asthma   . CKD (chronic kidney disease), stage III   . Depression   . Diabetes mellitus, type II ([place])   . GERD (gastroesophageal reflux disease)   . Hypertension   . Hypokalemia   . Hypothyroidism   . IBS (irritable bowel syndrome)   . Sleep apnea     Past Surgical History:  Procedure Laterality Date  . ABDOMINAL HYSTERECTOMY    . BACK SURGERY    . CESAREAN SECTION    . RETINAL TEAR REPAIR CRYOTHERAPY    . rotar cuff repair Right      Current Outpatient Medications:  .  Blood Glucose Monitoring Suppl (TRUE METRIX METER) w/Device KIT, Test BG qid. E11.65, Disp: 1 kit, Rfl: 0 .  cyclobenzaprine (FLEXERIL) 5 MG  tablet, Take 5 mg by mouth 3 (three) times daily as needed for muscle spasms., Disp: , Rfl:  .  glimepiride (AMARYL) 1 MG tablet, Take 0.5 mg by mouth daily., Disp: , Rfl:  .  glucose blood (TRUE METRIX BLOOD GLUCOSE TEST) test strip, Use as instructed 4 x daily. E11.65, Disp: 150 each, Rfl: 5 .  Lancets MISC, 1 each by Does not apply route 4 (four) times daily., Disp: 150 each, Rfl: 5 .  levothyroxine (SYNTHROID, LEVOTHROID) 88 MCG tablet, Take 88 mcg by mouth daily before breakfast., Disp: , Rfl:  .  metFORMIN (GLUCOPHAGE) 1000 MG tablet, Take 1,000 mg by mouth 2 (two) times daily with a meal., Disp: , Rfl:  .  Insulin Pen Needle (B-D ULTRAFINE III SHORT PEN) 31G X 8 MM MISC, 1 each by Does not apply route as directed. (Patient not taking: Reported on 11/12/2019), Disp: 100 each, Rfl: 3 .  irbesartan (AVAPRO) 150 MG tablet, Take 150 mg by mouth daily. (Patient not taking: Reported on 11/12/2019), Disp: , Rfl:  .  Lancets MISC, 1 each by Does not apply route as directed. (Patient not taking: Reported on 11/12/2019), Disp: 100 each, Rfl: 3 .  Multiple Vitamin (MULTIVITAMIN WITH MINERALS) TABS tablet, Take 1 tablet by mouth daily. (Patient not taking: Reported on 11/12/2019), Disp: , Rfl:  Allergies  Allergen Reactions  . Hydrochlorothiazide Other (See Comments)    Other reaction(s): itching Renal insufficiency Renal insufficiency   .  Sulfamethoxazole-Trimethoprim Itching and Rash    Other reaction(s): Itching   . Amlodipine Besylate Other (See Comments)    angioedema    Social History   Tobacco Use  . Smoking status: Never Smoker  . Smokeless tobacco: Never Used  Substance Use Topics  . Alcohol use: No    Family History  Problem Relation Age of Onset  . Thyroid disease Mother   . Hypertension Mother   . Hypertension Father       Review of Systems  Constitutional: negative for fatigue and weight loss Respiratory: negative for cough and wheezing Cardiovascular: negative for chest  pain, fatigue and palpitations Gastrointestinal: negative for abdominal pain and change in bowel habits Musculoskeletal:negative for myalgias Neurological: negative for gait problems and tremors Behavioral/Psych: negative for abusive relationship, depression Endocrine: negative for temperature intolerance    Genitourinary:negative for abnormal menstrual periods, genital lesions, hot flashes, sexual problems and vaginal discharge Integument/breast: negative for breast lump, breast tenderness, nipple discharge and skin lesion(s)    Objective:       BP 130/86   Pulse 90   Wt 249 lb 14.4 oz (113.4 kg)   BMI 42.90 kg/m  General:   alert  Skin:   no rash or abnormalities  Lungs:   clear to auscultation bilaterally  Heart:   regular rate and rhythm, S1, S2 normal, no murmur, click, rub or gallop  Breasts:   normal without suspicious masses, skin or nipple changes or axillary nodes  Abdomen:  normal findings: no organomegaly, soft, non-tender and no hernia  Pelvis:  External genitalia: normal general appearance Urinary system: urethral meatus normal and bladder without fullness, nontender Vaginal: normal without tenderness, induration or masses Cervix: normal appearance Adnexa: normal bimanual exam Uterus: anteverted and non-tender, normal size   Lab Review Urine pregnancy test Labs reviewed yes Radiologic studies reviewed yes  50% of 20 min visit spent on counseling and coordination of care.   Assessment:     1. Encounter for routine gynecological examination with Papanicolaou smear of cervix Rx: - POCT Urinalysis Dipstick  2. Vaginal discharge Rx: - Cervicovaginal ancillary only( [facility])  3. Screening breast examination Rx: - MM Digital Screening; Future  4. Class 3 severe obesity due to excess calories without serious comorbidity with body mass index (BMI) of 40.0 to 44.9 in adult [facility]) - program of caloric reduction, exercise and behavioral modification  recommended    Plan:    Education reviewed: calcium supplements, depression evaluation, low fat, low cholesterol diet, safe sex/STD prevention, self breast exams and weight bearing exercise. Mammogram ordered. Follow up in: 1 year.    Orders Placed This Encounter  Procedures  . MM Digital Screening    Standing Status:   Future    Standing Expiration Date:   11/11/2020    Order Specific Question:   Reason for Exam (SYMPTOM  OR DIAGNOSIS REQUIRED)    Answer:   Screening    Order Specific Question:   Is the patient pregnant?    Answer:   No    Order Specific Question:   Preferred imaging location?    Answer:   [facility]  . POCT Urinalysis Dipstick    Shelly Bombard, MD 11/12/2019 9:49 AM

## 2019-11-12 NOTE — Progress Notes (Signed)
Pt presents for annual needs Mammogram  Pt c/o abdominal pain and back pain Back surgery 03/2019 Recent abdominal u/s and blood work completed at [facility] per pt; does not know the results Pt also c/o insomnia

## 2019-11-13 LAB — CERVICOVAGINAL ANCILLARY ONLY
Bacterial Vaginitis (gardnerella): NEGATIVE
Candida Glabrata: NEGATIVE
Candida Vaginitis: NEGATIVE
Comment: NEGATIVE
Comment: NEGATIVE
Comment: NEGATIVE

## 2019-11-21 ENCOUNTER — Ambulatory Visit: Payer: 59 | Admitting: Dermatology

## 2020-01-24 ENCOUNTER — Other Ambulatory Visit: Payer: Self-pay

## 2020-01-24 ENCOUNTER — Encounter (HOSPITAL_COMMUNITY): Payer: Self-pay | Admitting: *Deleted

## 2020-01-24 ENCOUNTER — Emergency Department (HOSPITAL_COMMUNITY)
Admission: EM | Admit: 2020-01-24 | Discharge: 2020-01-25 | Disposition: A | Discharge status: Home / Self Care | Payer: 59 | Attending: Emergency Medicine | Admitting: Emergency Medicine

## 2020-01-24 DIAGNOSIS — Z7984 Long term (current) use of oral hypoglycemic drugs: Secondary | ICD-10-CM | POA: Diagnosis not present

## 2020-01-24 DIAGNOSIS — Z5321 Procedure and treatment not carried out due to patient leaving prior to being seen by health care provider: Secondary | ICD-10-CM | POA: Insufficient documentation

## 2020-01-24 DIAGNOSIS — R739 Hyperglycemia, unspecified: Secondary | ICD-10-CM | POA: Insufficient documentation

## 2020-01-24 LAB — I-STAT BETA HCG BLOOD, ED (MC, WL, AP ONLY): I-stat hCG, quantitative: <5 m[IU]/mL (ref <5)

## 2020-01-24 LAB — CBC
HCT: 45.8 % (ref 36.0–46.0)
Hemoglobin: 15 g/dL (ref 12.0–15.0)
MCH: 29.4 pg (ref 26.0–34.0)
MCHC: 32.8 g/dL (ref 30.0–36.0)
MCV: 89.6 fL (ref 80.0–100.0)
Platelets: 411 10*3/uL — ABNORMAL HIGH (ref 150–400)
RBC: 5.11 MIL/uL (ref 3.87–5.11)
RDW: 12.5 % (ref 11.5–15.5)
WBC: 12.6 10*3/uL — ABNORMAL HIGH (ref 4.0–10.5)
nRBC: 0 % (ref 0.0–0.2)

## 2020-01-24 LAB — BASIC METABOLIC PANEL
Anion gap: 10 (ref 5–15)
BUN: 14 mg/dL (ref 6–20)
CO2: 23 mmol/L (ref 22–32)
Calcium: 9.8 mg/dL (ref 8.9–10.3)
Chloride: 100 mmol/L (ref 98–111)
Creatinine, Ser: 1.02 mg/dL — ABNORMAL HIGH (ref 0.44–1.00)
GFR, Estimated: >60 mL/min (ref >60)
Glucose, Bld: 227 mg/dL — ABNORMAL HIGH (ref 70–99)
Potassium: 3.5 mmol/L (ref 3.5–5.1)
Sodium: 133 mmol/L — ABNORMAL LOW (ref 135–145)

## 2020-01-24 LAB — CBG MONITORING, ED: Glucose-Capillary: 227 mg/dL — ABNORMAL HIGH (ref 70–99)

## 2020-01-24 NOTE — ED Triage Notes (Signed)
Pt is here for hyperglycemia.  Pt has been on metformin and glipizide for a long time and has been taking it as prescribed.  Pt has been having increasing CBG and was seen at [facility] where they checked her urine only and then sent her to ED for further evaluation.

## 2020-01-25 LAB — URINALYSIS, ROUTINE W REFLEX MICROSCOPIC
Bilirubin Urine: NEGATIVE
Glucose, UA: 150 mg/dL — AB
Hgb urine dipstick: NEGATIVE
Ketones, ur: NEGATIVE mg/dL
Nitrite: NEGATIVE
Protein, ur: NEGATIVE mg/dL
Specific Gravity, Urine: 1.008 (ref 1.005–1.030)
pH: 5 (ref 5.0–8.0)

## 2020-01-25 NOTE — ED Notes (Signed)
Pt states she is not willing to wait any longer

## 2020-01-29 ENCOUNTER — Ambulatory Visit (INDEPENDENT_AMBULATORY_CARE_PROVIDER_SITE_OTHER): Payer: 59 | Admitting: Endocrinology

## 2020-01-29 ENCOUNTER — Other Ambulatory Visit: Payer: Self-pay | Admitting: Physician Assistant

## 2020-01-29 ENCOUNTER — Encounter: Payer: Self-pay | Admitting: Endocrinology

## 2020-01-29 ENCOUNTER — Telehealth: Payer: Self-pay | Admitting: Endocrinology

## 2020-01-29 ENCOUNTER — Other Ambulatory Visit: Payer: Self-pay

## 2020-01-29 VITALS — BP 132/92 | HR 100 | Ht 65.25 in | Wt 242.0 lb

## 2020-01-29 DIAGNOSIS — E1165 Type 2 diabetes mellitus with hyperglycemia: Secondary | ICD-10-CM | POA: Diagnosis not present

## 2020-01-29 DIAGNOSIS — M545 Low back pain, unspecified: Secondary | ICD-10-CM

## 2020-01-29 DIAGNOSIS — Z981 Arthrodesis status: Secondary | ICD-10-CM

## 2020-01-29 DIAGNOSIS — G8929 Other chronic pain: Secondary | ICD-10-CM

## 2020-01-29 LAB — BASIC METABOLIC PANEL WITH GFR
BUN: 11 mg/dL (ref 6–23)
CO2: 25 meq/L (ref 19–32)
Calcium: 9.7 mg/dL (ref 8.4–10.5)
Chloride: 98 meq/L (ref 96–112)
Creatinine, Ser: 0.97 mg/dL (ref 0.40–1.20)
GFR: 65.16 mL/min (ref >60.00)
Glucose, Bld: 287 mg/dL — ABNORMAL HIGH (ref 70–99)
Potassium: 4.3 meq/L (ref 3.5–5.1)
Sodium: 132 meq/L — ABNORMAL LOW (ref 135–145)

## 2020-01-29 LAB — T4, FREE: Free T4: 0.54 ng/dL — ABNORMAL LOW (ref 0.60–1.60)

## 2020-01-29 LAB — TSH: TSH: 6.49 u[IU]/mL — ABNORMAL HIGH (ref 0.35–4.50)

## 2020-01-29 MED ORDER — ACCU-CHEK GUIDE ME W/DEVICE KIT: 1.0000 | PACK | Freq: Once | 0 refills | Status: AC

## 2020-01-29 MED ORDER — ACCU-CHEK AVIVA PLUS VI STRP: 1.0000 | ORAL_STRIP | Freq: Two times a day (BID) | 3 refills | Status: DC

## 2020-01-29 MED ORDER — BASAGLAR KWIKPEN 100 UNIT/ML ~~LOC~~ SOPN: 40.0000 [IU] | PEN_INJECTOR | SUBCUTANEOUS | 3 refills | Status: DC

## 2020-01-29 MED ORDER — LEVOTHYROXINE SODIUM 100 MCG PO TABS
100.0000 ug | ORAL_TABLET | Freq: Every day | ORAL | 3 refills | Status: DC
Start: 2020-01-29 — End: 2020-03-07

## 2020-01-29 MED ORDER — ACCU-CHEK GUIDE VI STRP: 1.0000 | ORAL_STRIP | Freq: Two times a day (BID) | 12 refills | Status: DC

## 2020-01-29 MED ORDER — ACCU-CHEK AVIVA DEVI: 1.0000 | Freq: Once | 0 refills | Status: DC

## 2020-01-29 MED ORDER — LANTUS SOLOSTAR 100 UNIT/ML ~~LOC~~ SOPN
40.0000 [IU] | PEN_INJECTOR | SUBCUTANEOUS | 99 refills | Status: DC
Start: 2020-01-29 — End: 2020-01-29

## 2020-01-29 NOTE — Telephone Encounter (Signed)
Patient called stating when she went to get her Rx at pharmacy, they informed her that accu-chek aviva, and lantus were not covered and as an alternative, could we please call in Basaglar and accu-chek guide (meter and strips) to:  [facility] [address], [place] - [address]  [address] [place] [address]  Phone:  [phone] Fax:  [phone]

## 2020-01-29 NOTE — Patient Instructions (Addendum)
Your blood pressure is high today.  Please see your primary care provider soon, to have it rechecked good diet and exercise significantly improve the control of your diabetes.  please let me know if you wish to be referred to a dietician.  high blood sugar is very risky to your health.  you should see an eye doctor and dentist every year.  It is very important to get all recommended vaccinations.  Controlling your blood pressure and cholesterol drastically reduces the damage diabetes does to your body.  Those who smoke should quit.  Please discuss these with your doctor.  check your blood sugar twice a day.  vary the time of day when you check, between before the 3 meals, and at bedtime.  also check if you have symptoms of your blood sugar being too high or too low.  please keep a record of the readings and bring it to your next appointment here (or you can bring the meter itself).  You can write it on any piece of paper.  please call us sooner if your blood sugar goes below 70, or if you have a lot of readings over 200.  I have sent prescriptions to your pharmacy, for Lantus, a new meter, and strips.   Blood tests are requested for you today.  We'll let you know about the results.  Please come back for a follow-up appointment in 1 month.

## 2020-01-29 NOTE — Progress Notes (Signed)
Subjective:    Patient ID: Sharon Carson, female    DOB: 1960-11-16, 59 y.o.   MRN: [id number]  HPI pt is referred by Dr. Carmell Austria, for diabetes.  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  She takes 2 oral meds.  Therapy has been limited by cost of meds.  Main symptoms are fatigue and depression.  She says cbg's are in the 300's.   Past Medical History:  Diagnosis Date  . Asthma   . CKD (chronic kidney disease), stage III (HCC)   . Depression   . Diabetes mellitus, type II (HCC)   . GERD (gastroesophageal reflux disease)   . Hypertension   . Hypokalemia   . Hypothyroidism   . IBS (irritable bowel syndrome)   . Sleep apnea     Past Surgical History:  Procedure Laterality Date  . ABDOMINAL HYSTERECTOMY    . BACK SURGERY    . CESAREAN SECTION    . RETINAL TEAR REPAIR CRYOTHERAPY    . rotar cuff repair Right     Social History   Socioeconomic History  . Marital status: Single    Spouse name: Not on file  . Number of children: Not on file  . Years of education: Not on file  . Highest education level: Not on file  Occupational History  . Not on file  Tobacco Use  . Smoking status: Never Smoker  . Smokeless tobacco: Never Used  Vaping Use  . Vaping Use: Never used  Substance and Sexual Activity  . Alcohol use: No  . Drug use: No  . Sexual activity: Not Currently    Birth control/protection: Surgical    Comment: Hyst   Other Topics Concern  . Not on file  Social History Narrative  . Not on file   Social Determinants of Health   Financial Resource Strain:   . Difficulty of Paying Living Expenses: Not on file  Food Insecurity:   . Worried About Programme researcher, broadcasting/film/video in the Last Year: Not on file  . Ran Out of Food in the Last Year: Not on file  Transportation  Needs:   . Lack of Transportation (Medical): Not on file  . Lack of Transportation (Non-Medical): Not on file  Physical Activity:   . Days of Exercise per Week: Not on file  . Minutes of Exercise per Session: Not on file  Stress:   . Feeling of Stress : Not on file  Social Connections:   . Frequency of Communication with Friends and Family: Not on file  . Frequency of Social Gatherings with Friends and Family: Not on file  . Attends Religious Services: Not on file  . Active Member of Clubs or Organizations: Not on file  . Attends Banker Meetings: Not on file  . Marital Status: Not on file  Intimate Partner Violence:   . Fear of Current or Ex-Partner: Not on file  . Emotionally Abused: Not on file  . Physically Abused: Not on file  . Sexually Abused: Not on file    Current Outpatient Medications on File Prior to Visit  Medication Sig Dispense Refill  . cyclobenzaprine (FLEXERIL) 5 MG tablet Take 5 mg by mouth 3 (three) times daily as needed for muscle spasms.    Marland Kitchen  glimepiride (AMARYL) 1 MG tablet Take 0.5 mg by mouth daily.    . Insulin Pen Needle (B-D ULTRAFINE III SHORT PEN) 31G X 8 MM MISC 1 each by Does not apply route as directed. 100 each 3  . metFORMIN (GLUCOPHAGE) 1000 MG tablet Take 1,000 mg by mouth 2 (two) times daily with a meal.    . Multiple Vitamin (MULTIVITAMIN WITH MINERALS) TABS tablet Take 1 tablet by mouth daily.     . irbesartan (AVAPRO) 150 MG tablet Take 150 mg by mouth daily. (Patient not taking: Reported on 11/12/2019)     No current facility-administered medications on file prior to visit.    Allergies  Allergen Reactions  . Hydrochlorothiazide Other (See Comments)    Other reaction(s): itching Renal insufficiency Renal insufficiency   . Sulfamethoxazole-Trimethoprim Itching and Rash    Other reaction(s): Itching   . Amlodipine Besylate Other (See Comments)    angioedema    Family History  Problem Relation Age of Onset  . Thyroid  disease Mother   . Hypertension Mother   . Diabetes Mother   . Hypertension Father     BP (!) 132/92 (BP Location: Left Arm, Patient Position: Sitting, Cuff Size: Large)   Pulse 100   Ht 5' 5.25" (1.657 m)   Wt 242 lb (109.8 kg)   SpO2 97%   BMI 39.96 kg/m     Review of Systems denies n/v, urinary frequency.  She has lost a few lbs--intentional.       Objective:   Physical Exam VITAL SIGNS:  See vs page GENERAL: no distress Pulses: dorsalis pedis intact bilat.   MSK: no deformity of the feet CV: trace bilat leg edema Skin:  no ulcer on the feet.  normal color and temp on the feet.  Neuro: sensation is intact to touch on the feet, but decreased from normal.   Ext: there is bilateral onychomycosis of the toenails.   A1c=12.5%  I have reviewed outside records, and summarized: Pt was noted to have elevated A1c, and referred here.  Glycemic control was limited by hypoglycemia, so amaryl was reduced     Assessment & Plan:  Type 2 DM, with DR.  Uncontrolled.  I told pt she needs to resume insulin.  She declines multiple daily injections.   HTN: is noted today.   Patient Instructions  Your blood pressure is high today.  Please see your primary care provider soon, to have it rechecked good diet and exercise significantly improve the control of your diabetes.  please let me know if you wish to be referred to a dietician.  high blood sugar is very risky to your health.  you should see an eye doctor and dentist every year.  It is very important to get all recommended vaccinations.  Controlling your blood pressure and cholesterol drastically reduces the damage diabetes does to your body.  Those who smoke should quit.  Please discuss these with your doctor.  check your blood sugar twice a day.  vary the time of day when you check, between before the 3 meals, and at bedtime.  also check if you have symptoms of your blood sugar being too high or too low.  please keep a record of the  readings and bring it to your next appointment here (or you can bring the meter itself).  You can write it on any piece of paper.  please call us sooner if your blood sugar goes below 70, or if you have a lot  of readings over 200.  I have sent prescriptions to your pharmacy, for Lantus, a new meter, and strips.   Blood tests are requested for you today.  We'll let you know about the results.  Please come back for a follow-up appointment in 1 month.

## 2020-01-29 NOTE — Telephone Encounter (Signed)
done

## 2020-01-30 ENCOUNTER — Telehealth: Payer: Self-pay

## 2020-01-30 NOTE — Telephone Encounter (Signed)
-----   Message from Sean Ellison, MD sent at 01/29/2020  7:13 PM EDT ----- please contact patient: We need to slightly increase the thyroid pill.  I have sent a prescription to your pharmacy. I'll see you next time.   

## 2020-01-30 NOTE — Telephone Encounter (Signed)
Left message for patient to call back regarding results and recommendations. °

## 2020-01-30 NOTE — Telephone Encounter (Signed)
Pharmacy called to advise that the insurance will cover One Touch Meter and supplies. Please send meter kit, lancets, and test strips  Pharmacy called to advise that they will be sending prior auth paperwork for [place]. Per pharmacy all medications in this class will require a PA  [place], [place].  [address] [place] [address]  Phone:  [phone] Fax:  [phone]

## 2020-01-31 ENCOUNTER — Telehealth: Payer: Self-pay | Admitting: Endocrinology

## 2020-01-31 ENCOUNTER — Other Ambulatory Visit: Payer: Self-pay

## 2020-01-31 MED ORDER — ONETOUCH ULTRASOFT LANCETS MISC: 12 refills | Status: AC

## 2020-01-31 MED ORDER — ONETOUCH VERIO VI STRP: ORAL_STRIP | 12 refills | Status: AC

## 2020-01-31 MED ORDER — ONETOUCH VERIO W/DEVICE KIT: PACK | 0 refills | Status: AC

## 2020-01-31 NOTE — Telephone Encounter (Signed)
Pharmacy called asking if we had gotten the fax for the basaglar PA? They also need a PA for all the One Touch supplies, they said they are sending that over now.

## 2020-01-31 NOTE — Telephone Encounter (Signed)
I sent rx for basaglar 2 days ago

## 2020-01-31 NOTE — Progress Notes (Signed)
Key: [id number] - PA Case ID: [id number] - Rx #: [id number] Need help? Call us at [phone] Status Sent to [facility] Lantus SoloStar 100UNIT/ML pen-injectors Form [employer] Electronic Prior Authorization Form (2017 NCPDP) Original Claim Info 70 Plan ExclusionRoute of Administration Not CoveredLower Cost Alts: Hospital doctor, Levemir, Levemir FlexTouch

## 2020-01-31 NOTE — Progress Notes (Addendum)
Lantus was denied.  Cost Alts: Basaglar, Levemir, Levemir FlexTouch.  Please advise.  basaglar is what I sent 2 days ago

## 2020-02-03 NOTE — Telephone Encounter (Signed)
I am unaware 

## 2020-02-04 ENCOUNTER — Telehealth: Payer: Self-pay

## 2020-02-04 NOTE — Telephone Encounter (Signed)
-----   Message from Romero Belling, MD sent at 01/29/2020  7:13 PM EDT ----- please contact patient: We need to slightly increase the thyroid pill.  I have sent a prescription to your pharmacy. I'll see you next time.

## 2020-02-04 NOTE — Telephone Encounter (Signed)
Informed patient of lab results and recommendations. °

## 2020-02-06 ENCOUNTER — Telehealth: Payer: Self-pay

## 2020-02-06 NOTE — Telephone Encounter (Signed)
Patient is calling in saying she was here on the 26th, and Dr.Ellison increased her thyroid medication, she would like a returned phone call about her thyroid levels and what the increase was.

## 2020-02-07 ENCOUNTER — Other Ambulatory Visit: Payer: Self-pay | Admitting: Physician Assistant

## 2020-02-07 DIAGNOSIS — R1033 Periumbilical pain: Secondary | ICD-10-CM

## 2020-02-07 NOTE — Telephone Encounter (Signed)
Results clarified for patient.

## 2020-02-12 ENCOUNTER — Other Ambulatory Visit: Payer: 59

## 2020-02-21 ENCOUNTER — Other Ambulatory Visit: Payer: 59

## 2020-02-23 ENCOUNTER — Ambulatory Visit
Admission: RE | Admit: 2020-02-23 | Discharge: 2020-02-23 | Disposition: A | Discharge status: Home / Self Care | Payer: 59 | Source: Ambulatory Visit | Attending: Physician Assistant | Admitting: Physician Assistant

## 2020-02-23 ENCOUNTER — Other Ambulatory Visit: Payer: Self-pay

## 2020-02-23 DIAGNOSIS — G8929 Other chronic pain: Secondary | ICD-10-CM

## 2020-02-23 DIAGNOSIS — M545 Low back pain, unspecified: Secondary | ICD-10-CM

## 2020-02-23 MED ORDER — GADOBENATE DIMEGLUMINE 529 MG/ML IV SOLN
20.0000 mL | Freq: Once | INTRAVENOUS | Status: AC | PRN
  Administered 2020-02-23: 20 mL via INTRAVENOUS

## 2020-02-26 ENCOUNTER — Ambulatory Visit
Admission: RE | Admit: 2020-02-26 | Discharge: 2020-02-26 | Disposition: A | Discharge status: Home / Self Care | Payer: 59 | Source: Ambulatory Visit | Attending: Physician Assistant | Admitting: Physician Assistant

## 2020-02-26 ENCOUNTER — Other Ambulatory Visit: Payer: Self-pay

## 2020-02-26 DIAGNOSIS — R1033 Periumbilical pain: Secondary | ICD-10-CM

## 2020-02-26 DIAGNOSIS — Z981 Arthrodesis status: Secondary | ICD-10-CM

## 2020-02-26 MED ORDER — IOPAMIDOL (ISOVUE-300) INJECTION 61%
125.0000 mL | Freq: Once | INTRAVENOUS | Status: AC | PRN
  Administered 2020-02-26: 125 mL via INTRAVENOUS

## 2020-03-03 ENCOUNTER — Other Ambulatory Visit: Payer: 59

## 2020-03-07 ENCOUNTER — Encounter: Payer: Self-pay | Admitting: Endocrinology

## 2020-03-07 ENCOUNTER — Ambulatory Visit (INDEPENDENT_AMBULATORY_CARE_PROVIDER_SITE_OTHER): Payer: 59 | Admitting: Endocrinology

## 2020-03-07 ENCOUNTER — Other Ambulatory Visit: Payer: Self-pay

## 2020-03-07 VITALS — BP 126/82 | HR 104 | Ht 65.25 in | Wt 246.0 lb

## 2020-03-07 DIAGNOSIS — L680 Hirsutism: Secondary | ICD-10-CM

## 2020-03-07 DIAGNOSIS — E039 Hypothyroidism, unspecified: Secondary | ICD-10-CM | POA: Diagnosis not present

## 2020-03-07 DIAGNOSIS — E1165 Type 2 diabetes mellitus with hyperglycemia: Secondary | ICD-10-CM | POA: Diagnosis not present

## 2020-03-07 LAB — BASIC METABOLIC PANEL
BUN: 15 mg/dL (ref 6–23)
CO2: 26 mEq/L (ref 19–32)
Calcium: 9.9 mg/dL (ref 8.4–10.5)
Chloride: 96 mEq/L (ref 96–112)
Creatinine, Ser: 1.09 mg/dL (ref 0.40–1.20)
GFR: 56.61 mL/min — ABNORMAL LOW (ref >60.00)
Glucose, Bld: 315 mg/dL — ABNORMAL HIGH (ref 70–99)
Potassium: 4.1 mEq/L (ref 3.5–5.1)
Sodium: 132 mEq/L — ABNORMAL LOW (ref 135–145)

## 2020-03-07 LAB — TSH: TSH: 9.14 u[IU]/mL — ABNORMAL HIGH (ref 0.35–4.50)

## 2020-03-07 LAB — T4, FREE: Free T4: 0.78 ng/dL (ref 0.60–1.60)

## 2020-03-07 LAB — HEMOGLOBIN A1C: Hgb A1c MFr Bld: 12.7 % — ABNORMAL HIGH (ref 4.6–6.5)

## 2020-03-07 MED ORDER — LEVOTHYROXINE SODIUM 112 MCG PO TABS: 112.0000 ug | ORAL_TABLET | Freq: Every day | ORAL | 3 refills | Status: DC

## 2020-03-07 MED ORDER — INSULIN NPH (HUMAN) (ISOPHANE) 100 UNIT/ML ~~LOC~~ SUSP: 30.0000 [IU] | SUBCUTANEOUS | 11 refills | Status: DC

## 2020-03-07 NOTE — Progress Notes (Signed)
Subjective:    Patient ID: Sharon Carson, female    DOB: 10/25/1960, 59 y.o.   MRN: [id number]  HPI Pt returns for f/u of diabetes mellitus: DM type: Insulin-requiring type 2 DM Dx'ed: 0165 Complications: DR and PN Therapy: insulin since 2021 GDM: never DKA: never Severe hypoglycemia: never Pancreatitis: never Pancreatic imaging: normal on 2021 CT SDOH: none yet Other: she also took insulin for a brief time in 2019 (60 units qd); She declines multiple daily injections Interval history: Pt says she has been unable to obtain the insulin.  Our office has called insurance, and copay was $95.  no cbg record, but states cbg's vary from 200-300's.  Past Medical History:  Diagnosis Date  . Asthma   . CKD (chronic kidney disease), stage III ([facility])   . Depression   . Diabetes mellitus, type II ([facility])   . GERD (gastroesophageal reflux disease)   . Hypertension   . Hypokalemia   . Hypothyroidism   . IBS (irritable bowel syndrome)   . Sleep apnea     Past Surgical History:  Procedure Laterality Date  . ABDOMINAL HYSTERECTOMY    . BACK SURGERY    . CESAREAN SECTION    . RETINAL TEAR REPAIR CRYOTHERAPY    . rotar cuff repair Right     Social History   Socioeconomic History  . Marital status: Single    Spouse name: Not on file  . Number of children: Not on file  . Years of education: Not on file  . Highest education level: Not on file  Occupational History  . Not on file  Tobacco Use  . Smoking status: Never Smoker  . Smokeless tobacco: Never Used  Vaping Use  . Vaping Use: Never used  Substance and Sexual Activity  . Alcohol use: No  . Drug use: No  . Sexual activity: Not Currently    Birth control/protection: Surgical    Comment: Hyst   Other Topics Concern  . Not on file  Social History Narrative  . Not on file   Social Determinants of Health   Financial Resource Strain:   . Difficulty of Paying Living Expenses: Not on file  Food Insecurity:   .  Worried About Charity fundraiser in the Last Year: Not on file  . Ran Out of Food in the Last Year: Not on file  Transportation Needs:   . Lack of Transportation (Medical): Not on file  . Lack of Transportation (Non-Medical): Not on file  Physical Activity:   . Days of Exercise per Week: Not on file  . Minutes of Exercise per Session: Not on file  Stress:   . Feeling of Stress : Not on file  Social Connections:   . Frequency of Communication with Friends and Family: Not on file  . Frequency of Social Gatherings with Friends and Family: Not on file  . Attends Religious Services: Not on file  . Active Member of Clubs or Organizations: Not on file  . Attends Archivist Meetings: Not on file  . Marital Status: Not on file  Intimate Partner Violence:   . Fear of Current or Ex-Partner: Not on file  . Emotionally Abused: Not on file  . Physically Abused: Not on file  . Sexually Abused: Not on file    Current Outpatient Medications on File Prior to Visit  Medication Sig Dispense Refill  . cyclobenzaprine (FLEXERIL) 5 MG tablet Take 5 mg by mouth 3 (three) times daily as needed  for muscle spasms.    Marland Kitchen glimepiride (AMARYL) 1 MG tablet Take 0.5 mg by mouth daily.    . irbesartan (AVAPRO) 150 MG tablet Take 150 mg by mouth daily.     . metFORMIN (GLUCOPHAGE) 1000 MG tablet Take 1,000 mg by mouth 2 (two) times daily with a meal.    . Multiple Vitamin (MULTIVITAMIN WITH MINERALS) TABS tablet Take 1 tablet by mouth daily.     . Blood Glucose Monitoring Suppl (ONETOUCH VERIO) w/Device KIT Use 1-4 times daily as needed/directed DX E11.9 (Patient not taking: Reported on 03/07/2020) 1 kit 0  . Insulin Pen Needle (B-D ULTRAFINE III SHORT PEN) 31G X 8 MM MISC 1 each by Does not apply route as directed. (Patient not taking: Reported on 03/07/2020) 100 each 3  . Lancets (ONETOUCH ULTRASOFT) lancets Use 1-4 times daily as needed/directed  DX E11.9 (Patient not taking: Reported on 03/07/2020) 200  each 12  . ONETOUCH VERIO test strip Use 1-4 times daily as directed/needed   DX E11.9 (Patient not taking: Reported on 03/07/2020) 200 each 12   No current facility-administered medications on file prior to visit.    Allergies  Allergen Reactions  . Hydrochlorothiazide Other (See Comments)    Other reaction(s): itching Renal insufficiency Renal insufficiency   . Sulfamethoxazole-Trimethoprim Itching and Rash    Other reaction(s): Itching   . Amlodipine Besylate Other (See Comments)    angioedema    Family History  Problem Relation Age of Onset  . Thyroid disease Mother   . Hypertension Mother   . Diabetes Mother   . Hypertension Father     BP 126/82 (BP Location: Left Arm, Patient Position: Sitting, Cuff Size: Large)   Pulse (!) 104   Ht 5' 5.25" (1.657 m)   Wt 246 lb (111.6 kg)   SpO2 98%   BMI 40.62 kg/m    Review of Systems She denies hypoglycemia.      Objective:   Physical Exam VITAL SIGNS:  See vs page GENERAL: no distress FACE: slight terminal hair Pulses: dorsalis pedis intact bilat.   MSK: no deformity of the feet CV: no leg edema Skin:  no ulcer on the feet.  normal color and temp on the feet. Neuro: sensation is intact to touch on the feet, but decreased from normal  Lab Results  Component Value Date   HGBA1C 12.7 (H) 03/07/2020       Assessment & Plan:  Insulin-requiring type 2 DM, with DR: uncontrolled SDOH: she cannot afford brand name copay  Patient Instructions  check your blood sugar twice a day.  vary the time of day when you check, between before the 3 meals, and at bedtime.  also check if you have symptoms of your blood sugar being too high or too low.  please keep a record of the readings and bring it to your next appointment here (or you can bring the meter itself).  You can write it on any piece of paper.  please call us sooner if your blood sugar goes below 70, or if you have a lot of readings over 200.  I have sent prescriptions  to your pharmacy, for [facility] insulin. Blood tests are requested for you today.  We'll let you know about the results.  Please come back for a follow-up appointment in 2 months.

## 2020-03-07 NOTE — Patient Instructions (Addendum)
check your blood sugar twice a day.  vary the time of day when you check, between before the 3 meals, and at bedtime.  also check if you have symptoms of your blood sugar being too high or too low.  please keep a record of the readings and bring it to your next appointment here (or you can bring the meter itself).  You can write it on any piece of paper.  please call us sooner if your blood sugar goes below 70, or if you have a lot of readings over 200.  I have sent prescriptions to your pharmacy, for [facility] insulin. Blood tests are requested for you today.  We'll let you know about the results.  Please come back for a follow-up appointment in 2 months.

## 2020-03-09 LAB — TESTOSTERONE,FREE AND TOTAL
Testosterone, Free: 2 pg/mL (ref 0.0–4.2)
Testosterone: 19 ng/dL (ref 4–50)

## 2020-03-10 ENCOUNTER — Ambulatory Visit: Payer: 59 | Admitting: Endocrinology

## 2020-03-10 LAB — DHEA-SULFATE: DHEA-SO4: 75 ug/dL (ref 8–188)

## 2020-04-16 ENCOUNTER — Other Ambulatory Visit: Payer: Self-pay

## 2020-04-17 ENCOUNTER — Encounter: Payer: Self-pay | Admitting: Endocrinology

## 2020-04-17 ENCOUNTER — Other Ambulatory Visit: Payer: Self-pay | Admitting: Endocrinology

## 2020-04-17 ENCOUNTER — Ambulatory Visit: Payer: BC Managed Care – PPO | Admitting: Endocrinology

## 2020-04-17 VITALS — BP 124/86 | HR 105 | Ht 64.0 in | Wt 247.0 lb

## 2020-04-17 DIAGNOSIS — E049 Nontoxic goiter, unspecified: Secondary | ICD-10-CM

## 2020-04-17 DIAGNOSIS — E039 Hypothyroidism, unspecified: Secondary | ICD-10-CM

## 2020-04-17 DIAGNOSIS — E1165 Type 2 diabetes mellitus with hyperglycemia: Secondary | ICD-10-CM | POA: Diagnosis not present

## 2020-04-17 LAB — TSH: TSH: 10.78 u[IU]/mL — ABNORMAL HIGH (ref 0.35–4.50)

## 2020-04-17 LAB — T4, FREE: Free T4: 0.53 ng/dL — ABNORMAL LOW (ref 0.60–1.60)

## 2020-04-17 MED ORDER — LEVOTHYROXINE SODIUM 137 MCG PO TABS: 137.0000 ug | ORAL_TABLET | Freq: Every day | ORAL | 2 refills | Status: DC

## 2020-04-17 MED ORDER — LANTUS SOLOSTAR 100 UNIT/ML ~~LOC~~ SOPN: 50.0000 [IU] | PEN_INJECTOR | SUBCUTANEOUS | 99 refills | Status: DC

## 2020-04-17 NOTE — Patient Instructions (Addendum)
check your blood sugar twice a day.  vary the time of day when you check, between before the 3 meals, and at bedtime.  also check if you have symptoms of your blood sugar being too high or too low.  please keep a record of the readings and bring it to your next appointment here (or you can bring the meter itself).  You can write it on any piece of paper.  please call us sooner if your blood sugar goes below 70, or if you have a lot of readings over 200.  Blood tests are requested for you today.  We'll let you know about the results.  Let's recheck the ultrasound.  you will receive a phone call, about a day and time for an appointment.  I have sent a prescription to your pharmacy, to change the insulin to Lantus, 50 units each morning.   Please come back for a follow-up appointment in 1 month.

## 2020-04-17 NOTE — Progress Notes (Signed)
Subjective:    Patient ID: Sharon Carson, female    DOB: 11/02/1960, 60 y.o.   MRN: [id number]  HPI Pt returns for f/u of diabetes mellitus:  DM type: Insulin-requiring type 2 DM Dx'ed: 9450 Complications: DR and PN Therapy: insulin since 2021 GDM: never DKA: never Severe hypoglycemia: never Pancreatitis: never Pancreatic imaging: normal on 2021 CT SDOH: none yet Other: she also took insulin for a brief time in 2019 (60 units qd); She declines multiple daily injections Interval history: Pt says ins is better now, so she can buy name brand insulin.  She takes 30 units qam.  no cbg record, but states cbg's vary from 279-400.   She reports swelling at the ant neck.  She had [place] approx 1985.   Past Medical History:  Diagnosis Date  . Asthma   . CKD (chronic kidney disease), stage III ([facility])   . Depression   . Diabetes mellitus, type II ([facility])   . GERD (gastroesophageal reflux disease)   . Hypertension   . Hypokalemia   . Hypothyroidism   . IBS (irritable bowel syndrome)   . Sleep apnea     Past Surgical History:  Procedure Laterality Date  . ABDOMINAL HYSTERECTOMY    . BACK SURGERY    . CESAREAN SECTION    . RETINAL TEAR REPAIR CRYOTHERAPY    . rotar cuff repair Right     Social History   Socioeconomic History  . Marital status: Single    Spouse name: Not on file  . Number of children: Not on file  . Years of education: Not on file  . Highest education level: Not on file  Occupational History  . Not on file  Tobacco Use  . Smoking status: Never Smoker  . Smokeless tobacco: Never Used  Vaping Use  . Vaping Use: Never used  Substance and Sexual Activity  . Alcohol use: No  . Drug use: No  . Sexual activity: Not Currently    Birth control/protection: Surgical    Comment: Hyst   Other Topics Concern  . Not on file  Social History Narrative  . Not on file   Social Determinants of Health   Financial Resource Strain: Not on file  Food Insecurity: Not  on file  Transportation Needs: Not on file  Physical Activity: Not on file  Stress: Not on file  Social Connections: Not on file  Intimate Partner Violence: Not on file    Current Outpatient Medications on File Prior to Visit  Medication Sig Dispense Refill  . Blood Glucose Monitoring Suppl (ONETOUCH VERIO) w/Device KIT Use 1-4 times daily as needed/directed DX E11.9 1 kit 0  . cyclobenzaprine (FLEXERIL) 5 MG tablet Take 5 mg by mouth 3 (three) times daily as needed for muscle spasms.    Marland Kitchen glimepiride (AMARYL) 1 MG tablet Take 0.5 mg by mouth daily.    . Insulin Pen Needle (B-D ULTRAFINE III SHORT PEN) 31G X 8 MM MISC 1 each by Does not apply route as directed. 100 each 3  . irbesartan (AVAPRO) 150 MG tablet Take 150 mg by mouth daily.     . Lancets (ONETOUCH ULTRASOFT) lancets Use 1-4 times daily as needed/directed  DX E11.9 (Patient taking differently: Use 1-4 times daily as needed/directed  DX E11.9) 200 each 12  . metFORMIN (GLUCOPHAGE) 1000 MG tablet Take 1,000 mg by mouth 2 (two) times daily with a meal.    . Multiple Vitamin (MULTIVITAMIN WITH MINERALS) TABS tablet Take 1 tablet  by mouth daily.     Glory Rosebush VERIO test strip Use 1-4 times daily as directed/needed   DX E11.9 (Patient taking differently: Use 1-4 times daily as directed/needed   DX E11.9) 200 each 12   No current facility-administered medications on file prior to visit.    Allergies  Allergen Reactions  . Hydrochlorothiazide Other (See Comments)    Other reaction(s): itching Renal insufficiency Renal insufficiency   . Sulfamethoxazole-Trimethoprim Itching and Rash    Other reaction(s): Itching   . Amlodipine Besylate Other (See Comments)    angioedema    Family History  Problem Relation Age of Onset  . Thyroid disease Mother   . Hypertension Mother   . Diabetes Mother   . Hypertension Father     BP 124/86   Pulse (!) 105   Ht _0  (1.626 m)   Wt 247 lb (112 kg)   SpO2 98%   BMI 42.40 kg/m    Review of Systems She denies hypoglycemia.      Objective:   Physical Exam VITAL SIGNS:  See vs page GENERAL: no distress.   NECK: small MNG is noted.   Lab Results  Component Value Date   TSH 9.14 (H) 03/07/2020    Lab Results  Component Value Date   HGBA1C 12.7 (H) 03/07/2020      Assessment & Plan:  Insulin-requiring type 2 DM, with DR: uncontrolled.  Hypothyroidism: recheck today Goiter: due for receck.  Patient Instructions  check your blood sugar twice a day.  vary the time of day when you check, between before the 3 meals, and at bedtime.  also check if you have symptoms of your blood sugar being too high or too low.  please keep a record of the readings and bring it to your next appointment here (or you can bring the meter itself).  You can write it on any piece of paper.  please call us sooner if your blood sugar goes below 70, or if you have a lot of readings over 200.  Blood tests are requested for you today.  We'll let you know about the results.  Let's recheck the ultrasound.  you will receive a phone call, about a day and time for an appointment.  I have sent a prescription to your pharmacy, to change the insulin to Lantus, 50 units each morning.   Please come back for a follow-up appointment in 1 month.

## 2020-04-18 ENCOUNTER — Telehealth: Payer: Self-pay

## 2020-04-18 NOTE — Telephone Encounter (Signed)
Informed patient of results and medication change.

## 2020-04-18 NOTE — Telephone Encounter (Signed)
-----   Message from Romero Belling, MD sent at 04/17/2020  6:18 PM EST ----- please contact patient: we need to increase the thyroid pill.  I have sent a prescription to your pharmacy.  I'll see you next time.

## 2020-05-05 ENCOUNTER — Other Ambulatory Visit: Payer: BC Managed Care – PPO

## 2020-05-05 ENCOUNTER — Telehealth: Payer: Self-pay

## 2020-05-05 ENCOUNTER — Telehealth: Payer: Self-pay | Admitting: Endocrinology

## 2020-05-05 NOTE — Telephone Encounter (Signed)
LVM for pt per Dr. Everardo All to increase her Lantus to 60 units each morning.  Christy Gentles

## 2020-05-05 NOTE — Telephone Encounter (Signed)
Pt returned call regarding her High Blood Sugars.  Ph# [phone]

## 2020-05-05 NOTE — Telephone Encounter (Signed)
Please increase the Lantus to 60 units each morning.

## 2020-05-05 NOTE — Telephone Encounter (Signed)
LVM for pt to contact the office regarding her BS.  Sharon Carson

## 2020-05-05 NOTE — Telephone Encounter (Signed)
Pt called stating her blood sugars have been running high for the past 2 days and is very worried because it is started to affect her vision. Pt would like a nurse to give her a call. Ph# [phone]

## 2020-05-05 NOTE — Telephone Encounter (Signed)
Patient returned call and said that she had not ever picked up her Lantus and will need [facility] [address]

## 2020-05-05 NOTE — Telephone Encounter (Signed)
Spoke with pt and she stated that her BS has been running high for the past 2 days to where it is not giving her a reading. Today it was 415 and the lowest was last Friday 05/02/2020 217. She sated that it is affecting her vision and she does not know what to do. I asked her has she been eating anything and she stated "yes".  Please Advise.  Christy Gentles

## 2020-05-06 ENCOUNTER — Other Ambulatory Visit: Payer: Self-pay | Admitting: Endocrinology

## 2020-05-07 ENCOUNTER — Telehealth: Payer: Self-pay | Admitting: Endocrinology

## 2020-05-07 ENCOUNTER — Other Ambulatory Visit: Payer: Self-pay | Admitting: *Deleted

## 2020-05-07 ENCOUNTER — Other Ambulatory Visit: Payer: Self-pay | Admitting: Endocrinology

## 2020-05-07 DIAGNOSIS — E1165 Type 2 diabetes mellitus with hyperglycemia: Secondary | ICD-10-CM

## 2020-05-07 MED ORDER — LANTUS SOLOSTAR 100 UNIT/ML ~~LOC~~ SOPN: 60.0000 [IU] | PEN_INJECTOR | SUBCUTANEOUS | 99 refills | Status: DC

## 2020-05-07 NOTE — Telephone Encounter (Signed)
1. Any glargine is fine 2. please contact patient: is she on Jardiance?

## 2020-05-07 NOTE — Telephone Encounter (Signed)
Patient called re: Patient's insurance does not cover Lantus. Patient requests a new RX for Jardiance be sent to  [id number] [place], [place] - [address] Phone:  [phone]  Fax:  [phone]

## 2020-05-07 NOTE — Telephone Encounter (Signed)
Please advise 

## 2020-05-07 NOTE — Telephone Encounter (Signed)
Rx [facility]

## 2020-05-07 NOTE — Telephone Encounter (Signed)
Pt called to request a refill on her insulin.   Pt wanted Dr to know her insurance will pay for Jardiance...FYI  PHARMACY: [id number] [place], [place] - [address] Phone:  [phone]  Fax:  [phone]

## 2020-05-08 ENCOUNTER — Ambulatory Visit: Payer: 59 | Admitting: Endocrinology

## 2020-05-08 MED ORDER — EMPAGLIFLOZIN 10 MG PO TABS: 10.0000 mg | ORAL_TABLET | Freq: Every day | ORAL | 3 refills | Status: DC

## 2020-05-08 NOTE — Telephone Encounter (Signed)
Rx Jardiance a ok? Please advise

## 2020-05-08 NOTE — Telephone Encounter (Signed)
Please Advise

## 2020-05-08 NOTE — Telephone Encounter (Signed)
Patient called back to ask if the Jardiance going to be taking the place of her insulin.  Patient requests a call back not a MyChart message to address concerns phone # [phone]

## 2020-05-08 NOTE — Telephone Encounter (Signed)
Called pt --stated requesting the jardiance insulin instead the tablet and the insurance will cover for the insulin per pt.

## 2020-05-08 NOTE — Addendum Note (Signed)
Addended by: Romero Belling on: 05/08/2020 12:05 PM   Modules accepted: Orders

## 2020-05-08 NOTE — Telephone Encounter (Signed)
The [place] Pepper is a pill used in addition to the insulin

## 2020-05-08 NOTE — Telephone Encounter (Signed)
OK, I have sent a prescription to your pharmacy.  I'll see you next time.   

## 2020-05-08 NOTE — Telephone Encounter (Signed)
No, pt is not on Jardiance, she would like an Rx for it or a  generic of lantis. Insurance does not cover Lantis. Pt is out and needs it ASAP- A1C is 14  PHARMACY: [id number] [place], [place] - [address] Phone:  [phone]  Fax:  [phone]

## 2020-05-09 NOTE — Telephone Encounter (Signed)
Pt not taking insulin (lantus) due to insurance not covered anymore. Pt taking glipizide, metformin only and requesting Jardiance insulin which is covered by insulin. A1c 14 last Friday.

## 2020-05-09 NOTE — Telephone Encounter (Signed)
Pt called back to inform us that her insurance can cover these following Lantis alternatives along with the Jardiance.  Alternatives for lantis they cover are: Hospital doctor and Levemir

## 2020-05-09 NOTE — Telephone Encounter (Signed)
I just need to know what alternative to Lantus is covered, and I'll send rx

## 2020-05-09 NOTE — Telephone Encounter (Signed)
Pt will call insurance for other alternatives for lantus and will call the office back.

## 2020-05-12 MED ORDER — BASAGLAR KWIKPEN 100 UNIT/ML ~~LOC~~ SOPN: 60.0000 [IU] | PEN_INJECTOR | SUBCUTANEOUS | 3 refills | Status: DC

## 2020-05-12 NOTE — Telephone Encounter (Signed)
Please see below.

## 2020-05-12 NOTE — Telephone Encounter (Signed)
Ok, I have sent a prescription to your pharmacy 

## 2020-05-15 ENCOUNTER — Ambulatory Visit
Admission: RE | Admit: 2020-05-15 | Discharge: 2020-05-15 | Disposition: A | Discharge status: Home / Self Care | Payer: BC Managed Care – PPO | Source: Ambulatory Visit | Attending: Endocrinology | Admitting: Endocrinology

## 2020-05-15 ENCOUNTER — Other Ambulatory Visit: Payer: Self-pay

## 2020-05-15 DIAGNOSIS — E049 Nontoxic goiter, unspecified: Secondary | ICD-10-CM

## 2020-05-19 ENCOUNTER — Ambulatory Visit: Payer: BC Managed Care – PPO | Admitting: Endocrinology

## 2020-06-04 ENCOUNTER — Telehealth: Payer: Self-pay | Admitting: Endocrinology

## 2020-06-04 NOTE — Telephone Encounter (Signed)
Pt called stating Sharon Carson called her and wanted her to call back but I don't see a note in here what it was regarding. Pt would like a call back and if she doesn't pick up she would like a detailed message as to what the call is regarding.  Ph# [phone].

## 2020-06-06 NOTE — Telephone Encounter (Signed)
Patient experiencing low blood sugars in the 60's multiple times daily for the last 4-5 days both fasting and after meals. I have moved her appointment up from 06/23/20 but wanted to know what she can do between now and Monday - she is shaking, diaphoretic, and light headed.  Call back # [phone]

## 2020-06-06 NOTE — Telephone Encounter (Signed)
LVM for pt to F/U with her regarding her low BS.  Please see message below.  Please Advise

## 2020-06-07 NOTE — Telephone Encounter (Signed)
Reduce Lantus to 40 units qam

## 2020-06-07 NOTE — Telephone Encounter (Signed)
LVM for pt with new instruction for lantus per Dr. Everardo All.

## 2020-06-09 ENCOUNTER — Ambulatory Visit: Payer: BC Managed Care – PPO | Admitting: Endocrinology

## 2020-06-23 ENCOUNTER — Ambulatory Visit: Payer: BC Managed Care – PPO | Admitting: Endocrinology

## 2020-06-25 ENCOUNTER — Other Ambulatory Visit: Payer: Self-pay

## 2020-06-25 ENCOUNTER — Ambulatory Visit (INDEPENDENT_AMBULATORY_CARE_PROVIDER_SITE_OTHER): Payer: BC Managed Care – PPO | Admitting: Endocrinology

## 2020-06-25 VITALS — BP 126/74 | HR 92 | Ht 64.0 in | Wt 247.4 lb

## 2020-06-25 DIAGNOSIS — E1165 Type 2 diabetes mellitus with hyperglycemia: Secondary | ICD-10-CM

## 2020-06-25 LAB — POCT GLYCOSYLATED HEMOGLOBIN (HGB A1C): Hemoglobin A1C: 8.6 % — AB (ref 4.0–5.6)

## 2020-06-25 MED ORDER — TRULICITY 0.75 MG/0.5ML ~~LOC~~ SOAJ: 0.7500 mg | SUBCUTANEOUS | 3 refills | Status: DC

## 2020-06-25 MED ORDER — BASAGLAR KWIKPEN 100 UNIT/ML ~~LOC~~ SOPN: 30.0000 [IU] | PEN_INJECTOR | SUBCUTANEOUS | 3 refills | Status: DC

## 2020-06-25 NOTE — Patient Instructions (Addendum)
check your blood sugar twice a day.  vary the time of day when you check, between before the 3 meals, and at bedtime.  also check if you have symptoms of your blood sugar being too high or too low.  please keep a record of the readings and bring it to your next appointment here (or you can bring the meter itself).  You can write it on any piece of paper.  please call us sooner if your blood sugar goes below 70, or if you have a lot of readings over 200.  I have sent a prescription to your pharmacy, to add "Trulicity."  Please reduce the Basaglar to 30 units each morning.   Please come back for a follow-up appointment in 2 months.

## 2020-06-25 NOTE — Progress Notes (Signed)
Subjective:    Patient ID: Sharon Carson, female    DOB: [birth year], 60 y.o.   MRN: [id number]  HPI Pt returns for f/u of diabetes mellitus:  DM type: Insulin-requiring type 2 DM Dx'ed: 3810 Complications: DR and PN Therapy: insulin since 2021 GDM: never DKA: never Severe hypoglycemia: never Pancreatitis: never Pancreatic imaging: normal on 2021 CT SDOH: none yet Other: she also took insulin for a brief time in 2019 (60 units qd); She declines multiple daily injections Interval history: she takes 40 units qam.  no cbg record, but states cbg's vary from 98-189.  It is in general higher as the day goes on. She reports swelling at the ant neck.  She had [place] approx 1985.  F/u in 2022 showed diffuse goiter, no nodules.   Past Medical History:  Diagnosis Date  . Asthma   . CKD (chronic kidney disease), stage III ([place])   . Depression   . Diabetes mellitus, type II ([place])   . GERD (gastroesophageal reflux disease)   . Hypertension   . Hypokalemia   . Hypothyroidism   . IBS (irritable bowel syndrome)   . Sleep apnea     Past Surgical History:  Procedure Laterality Date  . ABDOMINAL HYSTERECTOMY    . BACK SURGERY    . CESAREAN SECTION    . RETINAL TEAR REPAIR CRYOTHERAPY    . rotar cuff repair Right     Social History   Socioeconomic History  . Marital status: Single    Spouse name: Not on file  . Number of children: Not on file  . Years of education: Not on file  . Highest education level: Not on file  Occupational History  . Not on file  Tobacco Use  . Smoking status: Never Smoker  . Smokeless tobacco: Never Used  Vaping Use  . Vaping Use: Never used  Substance and Sexual Activity  . Alcohol use: No  . Drug use: No  . Sexual activity: Not Currently    Birth control/protection: Surgical    Comment: Hyst   Other Topics Concern  . Not on file  Social History Narrative  . Not on file   Social Determinants of Health   Financial Resource Strain: Not on  file  Food Insecurity: Not on file  Transportation Needs: Not on file  Physical Activity: Not on file  Stress: Not on file  Social Connections: Not on file  Intimate Partner Violence: Not on file    Current Outpatient Medications on File Prior to Visit  Medication Sig Dispense Refill  . Blood Glucose Monitoring Suppl (ONETOUCH VERIO) w/Device KIT Use 1-4 times daily as needed/directed DX E11.9 1 kit 0  . cyclobenzaprine (FLEXERIL) 5 MG tablet Take 5 mg by mouth 3 (three) times daily as needed for muscle spasms.    . Insulin Pen Needle (B-D ULTRAFINE III SHORT PEN) 31G X 8 MM MISC 1 each by Does not apply route as directed. 100 each 3  . irbesartan (AVAPRO) 150 MG tablet Take 150 mg by mouth daily.     . Lancets (ONETOUCH ULTRASOFT) lancets Use 1-4 times daily as needed/directed  DX E11.9 (Patient taking differently: Use 1-4 times daily as needed/directed  DX E11.9) 200 each 12  . levothyroxine (SYNTHROID) 137 MCG tablet Take 1 tablet (137 mcg total) by mouth daily before breakfast. 30 tablet 2  . metFORMIN (GLUCOPHAGE) 1000 MG tablet Take 1,000 mg by mouth 2 (two) times daily with a meal.    .  Multiple Vitamin (MULTIVITAMIN WITH MINERALS) TABS tablet Take 1 tablet by mouth daily.     Glory Rosebush VERIO test strip Use 1-4 times daily as directed/needed   DX E11.9 (Patient taking differently: Use 1-4 times daily as directed/needed   DX E11.9) 200 each 12   No current facility-administered medications on file prior to visit.    Allergies  Allergen Reactions  . Hydrochlorothiazide Other (See Comments)    Other reaction(s): itching Renal insufficiency Renal insufficiency   . Sulfamethoxazole-Trimethoprim Itching and Rash    Other reaction(s): Itching   . Amlodipine Besylate Other (See Comments)    angioedema    Family History  Problem Relation Age of Onset  . Thyroid disease Mother   . Hypertension Mother   . Diabetes Mother   . Hypertension Father     BP 126/74 (BP Location:  Right Arm, Patient Position: Sitting, Cuff Size: Large)   Pulse 92   Ht _0  (1.626 m)   Wt 247 lb 6.4 oz (112.2 kg)   SpO2 96%   BMI 42.47 kg/m    Review of Systems     Objective:   Physical Exam VITAL SIGNS:  See vs page GENERAL: no distress Pulses: dorsalis pedis intact bilat.   MSK: no deformity of the feet CV: 1+ bilat leg edema.   Skin:  no ulcer on the feet.  normal color and temp on the feet.  Neuro: sensation is intact to touch on the feet, but decreased from normal Ext: there is bilateral onychomycosis of the toenails   Lab Results  Component Value Date   HGBA1C 8.6 (A) 06/25/2020       Assessment & Plan:  Insulin-requiring type 2 DM, with PN: uncontrolled.   Patient Instructions  check your blood sugar twice a day.  vary the time of day when you check, between before the 3 meals, and at bedtime.  also check if you have symptoms of your blood sugar being too high or too low.  please keep a record of the readings and bring it to your next appointment here (or you can bring the meter itself).  You can write it on any piece of paper.  please call us sooner if your blood sugar goes below 70, or if you have a lot of readings over 200.  I have sent a prescription to your pharmacy, to add "Trulicity."  Please reduce the Basaglar to 30 units each morning.   Please come back for a follow-up appointment in 2 months.

## 2020-08-26 ENCOUNTER — Ambulatory Visit (INDEPENDENT_AMBULATORY_CARE_PROVIDER_SITE_OTHER): Payer: BC Managed Care – PPO | Admitting: Endocrinology

## 2020-08-26 ENCOUNTER — Other Ambulatory Visit: Payer: Self-pay

## 2020-08-26 VITALS — BP 120/76 | HR 87 | Ht 64.0 in | Wt 240.6 lb

## 2020-08-26 DIAGNOSIS — E1165 Type 2 diabetes mellitus with hyperglycemia: Secondary | ICD-10-CM | POA: Diagnosis not present

## 2020-08-26 LAB — POCT GLYCOSYLATED HEMOGLOBIN (HGB A1C): Hemoglobin A1C: 7 % — AB (ref 4.0–5.6)

## 2020-08-26 MED ORDER — BASAGLAR KWIKPEN 100 UNIT/ML ~~LOC~~ SOPN: 20.0000 [IU] | PEN_INJECTOR | SUBCUTANEOUS | 3 refills | Status: DC

## 2020-08-26 MED ORDER — TRULICITY 1.5 MG/0.5ML ~~LOC~~ SOAJ: 1.5000 mg | SUBCUTANEOUS | 3 refills | Status: DC

## 2020-08-26 NOTE — Progress Notes (Signed)
Subjective:    Patient ID: Sharon Carson, female    DOB: 24-Nov-1960, 60 y.o.   MRN: [id number]  HPI Pt returns for f/u of diabetes mellitus:  DM type: Insulin-requiring type 2 DM Dx'ed: 1700 Complications: DR and PN Therapy: insulin since 1749, and Trulicity GDM: never DKA: never Severe hypoglycemia: never Pancreatitis: never Pancreatic imaging: normal on 2021 CT SDOH: none yet Other: she also took insulin for a brief time in 2019 (60 units qd); She declines multiple daily injections Interval history: no cbg record, but states cbg's vary from 85-150.  It is in general higher as the day goes on. She had thyroid US approx 1985.  F/u in 2022 showed diffuse goiter, no nodules.   Past Medical History:  Diagnosis Date  . Asthma   . CKD (chronic kidney disease), stage III ([place])   . Depression   . Diabetes mellitus, type II ([place])   . GERD (gastroesophageal reflux disease)   . Hypertension   . Hypokalemia   . Hypothyroidism   . IBS (irritable bowel syndrome)   . Sleep apnea     Past Surgical History:  Procedure Laterality Date  . ABDOMINAL HYSTERECTOMY    . BACK SURGERY    . CESAREAN SECTION    . RETINAL TEAR REPAIR CRYOTHERAPY    . rotar cuff repair Right     Social History   Socioeconomic History  . Marital status: Single    Spouse name: Not on file  . Number of children: Not on file  . Years of education: Not on file  . Highest education level: Not on file  Occupational History  . Not on file  Tobacco Use  . Smoking status: Never Smoker  . Smokeless tobacco: Never Used  Vaping Use  . Vaping Use: Never used  Substance and Sexual Activity  . Alcohol use: No  . Drug use: No  . Sexual activity: Not Currently    Birth control/protection: Surgical    Comment: Hyst   Other Topics Concern  . Not on file  Social History Narrative  . Not on file   Social Determinants of Health   Financial Resource Strain: Not on file  Food Insecurity: Not on file   Transportation Needs: Not on file  Physical Activity: Not on file  Stress: Not on file  Social Connections: Not on file  Intimate Partner Violence: Not on file    Current Outpatient Medications on File Prior to Visit  Medication Sig Dispense Refill  . Blood Glucose Monitoring Suppl (ONETOUCH VERIO) w/Device KIT Use 1-4 times daily as needed/directed DX E11.9 1 kit 0  . cyclobenzaprine (FLEXERIL) 5 MG tablet Take 5 mg by mouth 3 (three) times daily as needed for muscle spasms.    . Insulin Pen Needle (B-D ULTRAFINE III SHORT PEN) 31G X 8 MM MISC 1 each by Does not apply route as directed. 100 each 3  . irbesartan (AVAPRO) 150 MG tablet Take 150 mg by mouth daily.     . Lancets (ONETOUCH ULTRASOFT) lancets Use 1-4 times daily as needed/directed  DX E11.9 (Patient taking differently: Use 1-4 times daily as needed/directed  DX E11.9) 200 each 12  . levothyroxine (SYNTHROID) 137 MCG tablet Take 1 tablet (137 mcg total) by mouth daily before breakfast. 30 tablet 2  . metFORMIN (GLUCOPHAGE) 1000 MG tablet Take 1,000 mg by mouth 2 (two) times daily with a meal.    . Multiple Vitamin (MULTIVITAMIN WITH MINERALS) TABS tablet Take 1 tablet by  mouth daily.     Glory Rosebush VERIO test strip Use 1-4 times daily as directed/needed   DX E11.9 (Patient taking differently: Use 1-4 times daily as directed/needed   DX E11.9) 200 each 12   No current facility-administered medications on file prior to visit.    Allergies  Allergen Reactions  . Hydrochlorothiazide Other (See Comments)    Other reaction(s): itching Renal insufficiency Renal insufficiency   . Sulfamethoxazole-Trimethoprim Itching and Rash    Other reaction(s): Itching   . Amlodipine Besylate Other (See Comments)    angioedema    Family History  Problem Relation Age of Onset  . Thyroid disease Mother   . Hypertension Mother   . Diabetes Mother   . Hypertension Father     BP 120/76 (BP Location: Right Arm, Patient Position: Sitting,  Cuff Size: Large)   Pulse 87   Ht $R'5\' 4"'ML$  (1.626 m)   Wt 240 lb 9.6 oz (109.1 kg)   SpO2 96%   BMI 41.30 kg/m    Review of Systems She denies n/v/heartburn.  She denies hypoglycemia. She has lost a few lbs.      Objective:   Physical Exam VITAL SIGNS:  See vs page GENERAL: no distress Pulses: dorsalis pedis intact bilat.   MSK: no deformity of the feet CV: 1+ bilat leg edema.   Skin:  no ulcer on the feet.  normal color and temp on the feet.  Neuro: sensation is intact to touch on the feet, but decreased from normal Ext: there is bilateral onychomycosis of the toenails   Lab Results  Component Value Date   HGBA1C 7.0 (A) 08/26/2020       Assessment & Plan:  Insulin-requiring type 2 DM: well-controlled Obesity: uncontrolled.  We'll favor GLP rx over insulin  Patient Instructions  check your blood sugar twice a day.  vary the time of day when you check, between before the 3 meals, and at bedtime.  also check if you have symptoms of your blood sugar being too high or too low.  please keep a record of the readings and bring it to your next appointment here (or you can bring the meter itself).  You can write it on any piece of paper.  please call us sooner if your blood sugar goes below 70, or if you have a lot of readings over 200.  I have sent a prescription to your pharmacy, to increase the Trulicity, and: Please reduce the Basaglar to 20 units each morning.   Our goal is to go off the insulin.  Please come back for a follow-up appointment in 2 months.

## 2020-08-26 NOTE — Patient Instructions (Addendum)
check your blood sugar twice a day.  vary the time of day when you check, between before the 3 meals, and at bedtime.  also check if you have symptoms of your blood sugar being too high or too low.  please keep a record of the readings and bring it to your next appointment here (or you can bring the meter itself).  You can write it on any piece of paper.  please call us sooner if your blood sugar goes below 70, or if you have a lot of readings over 200.  I have sent a prescription to your pharmacy, to increase the Trulicity, and: Please reduce the Basaglar to 20 units each morning.   Our goal is to go off the insulin.  Please come back for a follow-up appointment in 2 months.

## 2020-09-10 DIAGNOSIS — M48061 Spinal stenosis, lumbar region without neurogenic claudication: Secondary | ICD-10-CM | POA: Insufficient documentation

## 2020-10-21 ENCOUNTER — Telehealth: Payer: Self-pay | Admitting: Endocrinology

## 2020-10-21 NOTE — Telephone Encounter (Signed)
Patient called to find out if she can safely try GOLO - Diet Plan and Supplement.  This is something she has seen on commercials for insulin resistance and losing weight.  Would like Dr Everardo All to review and advise on safety etc  Call back # [phone]

## 2020-10-23 NOTE — Telephone Encounter (Signed)
LVM for pt in regards to what Sharon Carson stated about taking the [facility] and his response to it. Waiting on a response back from pt.

## 2020-12-02 ENCOUNTER — Ambulatory Visit: Payer: BC Managed Care – PPO | Admitting: Endocrinology

## 2020-12-03 ENCOUNTER — Other Ambulatory Visit: Payer: Self-pay | Admitting: Endocrinology

## 2020-12-04 ENCOUNTER — Ambulatory Visit: Payer: BC Managed Care – PPO | Admitting: Endocrinology

## 2020-12-05 ENCOUNTER — Ambulatory Visit (INDEPENDENT_AMBULATORY_CARE_PROVIDER_SITE_OTHER): Payer: BC Managed Care – PPO | Admitting: Endocrinology

## 2020-12-05 ENCOUNTER — Other Ambulatory Visit: Payer: Self-pay

## 2020-12-05 VITALS — BP 100/60 | HR 93 | Ht 64.0 in | Wt 231.8 lb

## 2020-12-05 DIAGNOSIS — E1165 Type 2 diabetes mellitus with hyperglycemia: Secondary | ICD-10-CM | POA: Diagnosis not present

## 2020-12-05 LAB — POCT GLYCOSYLATED HEMOGLOBIN (HGB A1C): Hemoglobin A1C: 7.6 % — AB (ref 4.0–5.6)

## 2020-12-05 MED ORDER — TRULICITY 3 MG/0.5ML ~~LOC~~ SOAJ: 3.0000 mg | SUBCUTANEOUS | 3 refills | Status: DC

## 2020-12-05 NOTE — Patient Instructions (Addendum)
check your blood sugar twice a day.  vary the time of day when you check, between before the 3 meals, and at bedtime.  also check if you have symptoms of your blood sugar being too high or too low.  please keep a record of the readings and bring it to your next appointment here (or you can bring the meter itself).  You can write it on any piece of paper.  please call us sooner if your blood sugar goes below 70, or if you have a lot of readings over 200.  I have sent a prescription to your pharmacy, to increase the Trulicity again.   Please come back for a follow-up appointment in 3 months.

## 2020-12-05 NOTE — Progress Notes (Signed)
Subjective:    Patient ID: Sharon Carson, female    DOB: 01/18/1961, 60 y.o.   MRN: [id number]  HPI Pt returns for f/u of diabetes mellitus:  DM type: 2 Dx'ed: 0165 Complications: DR and PN Therapy: Trulicity GDM: never DKA: never Severe hypoglycemia: never Pancreatitis: never Pancreatic imaging: normal on 2021 CT SDOH: none Other: she also took insulin for a brief time in 2019; She declines multiple daily injections.   Interval history: no cbg record, but states cbg's vary from 100-150. She has not recently taken the insulin.   She had thyroid US approx 1985.  F/u in 2022 showed diffuse goiter, no nodules.   Past Medical History:  Diagnosis Date   Asthma    CKD (chronic kidney disease), stage III ([facility])    Depression    Diabetes mellitus, type II ([facility])    GERD (gastroesophageal reflux disease)    Hypertension    Hypokalemia    Hypothyroidism    IBS (irritable bowel syndrome)    Sleep apnea     Past Surgical History:  Procedure Laterality Date   ABDOMINAL HYSTERECTOMY     BACK SURGERY     CESAREAN SECTION     RETINAL TEAR REPAIR CRYOTHERAPY     rotar cuff repair Right     Social History   Socioeconomic History   Marital status: Single    Spouse name: Not on file   Number of children: Not on file   Years of education: Not on file   Highest education level: Not on file  Occupational History   Not on file  Tobacco Use   Smoking status: Never   Smokeless tobacco: Never  Vaping Use   Vaping Use: Never used  Substance and Sexual Activity   Alcohol use: No   Drug use: No   Sexual activity: Not Currently    Birth control/protection: Surgical    Comment: Hyst   Other Topics Concern   Not on file  Social History Narrative   Not on file   Social Determinants of Health   Financial Resource Strain: Not on file  Food Insecurity: Not on file  Transportation Needs: Not on file  Physical Activity: Not on file  Stress: Not on file  Social Connections:  Not on file  Intimate Partner Violence: Not on file    Current Outpatient Medications on File Prior to Visit  Medication Sig Dispense Refill   Blood Glucose Monitoring Suppl (ONETOUCH VERIO) w/Device KIT Use 1-4 times daily as needed/directed DX E11.9 1 kit 0   cyclobenzaprine (FLEXERIL) 5 MG tablet Take 5 mg by mouth 3 (three) times daily as needed for muscle spasms.     Insulin Pen Needle (B-D ULTRAFINE III SHORT PEN) 31G X 8 MM MISC 1 each by Does not apply route as directed. 100 each 3   irbesartan (AVAPRO) 150 MG tablet Take 150 mg by mouth daily.      Lancets (ONETOUCH ULTRASOFT) lancets Use 1-4 times daily as needed/directed  DX E11.9 (Patient taking differently: Use 1-4 times daily as needed/directed  DX E11.9) 200 each 12   levothyroxine (SYNTHROID) 137 MCG tablet TAKE 1 TABLET BY MOUTH DAILY BEFORE BREAKFAST. 30 tablet 1   metFORMIN (GLUCOPHAGE) 1000 MG tablet Take 1,000 mg by mouth 2 (two) times daily with a meal.     Multiple Vitamin (MULTIVITAMIN WITH MINERALS) TABS tablet Take 1 tablet by mouth daily.      ONETOUCH VERIO test strip Use 1-4 times daily as  directed/needed   DX E11.9 (Patient taking differently: Use 1-4 times daily as directed/needed   DX E11.9) 200 each 12   No current facility-administered medications on file prior to visit.    Allergies  Allergen Reactions   Hydrochlorothiazide Other (See Comments)    Other reaction(s): itching Renal insufficiency Renal insufficiency    Sulfamethoxazole-Trimethoprim Itching and Rash    Other reaction(s): Itching    Amlodipine Besylate Other (See Comments)    angioedema    Family History  Problem Relation Age of Onset   Thyroid disease Mother    Hypertension Mother    Diabetes Mother    Hypertension Father     BP 100/60 (BP Location: Right Arm, Patient Position: Sitting, Cuff Size: Large)   Pulse 93   Ht $R'5\' 4"'lT$  (1.626 m)   Wt 231 lb 12.8 oz (105.1 kg)   SpO2 97%   BMI 39.79 kg/m    Review of Systems She  denies hypoglycemia/n/v/hb    Objective:   Physical Exam Pulses: dorsalis pedis intact bilat.   MSK: no deformity of the feet CV: trace bilat leg edema.   Skin:  no ulcer on the feet.  normal color and temp on the feet.  Neuro: sensation is intact to touch on the feet, but decreased from normal Ext: there is bilateral onychomycosis of the toenails.    A1c=7.6%  Lab Results  Component Value Date   CREATININE 1.09 03/07/2020   BUN 15 03/07/2020   NA 132 (L) 03/07/2020   K 4.1 03/07/2020   CL 96 03/07/2020   CO2 26 03/07/2020      Assessment & Plan:  Type 2 DM: uncontrolled off insulin, but we may be able to control on Trulicity.  Patient Instructions  check your blood sugar twice a day.  vary the time of day when you check, between before the 3 meals, and at bedtime.  also check if you have symptoms of your blood sugar being too high or too low.  please keep a record of the readings and bring it to your next appointment here (or you can bring the meter itself).  You can write it on any piece of paper.  please call us sooner if your blood sugar goes below 70, or if you have a lot of readings over 200.  I have sent a prescription to your pharmacy, to increase the Trulicity again.   Please come back for a follow-up appointment in 3 months.

## 2020-12-11 ENCOUNTER — Telehealth: Payer: Self-pay | Admitting: Endocrinology

## 2020-12-11 NOTE — Telephone Encounter (Signed)
please contact patient: [facility] does not have Trulicity.  Which other pharmacy should I send it to?

## 2020-12-12 MED ORDER — TRULICITY 3 MG/0.5ML ~~LOC~~ SOAJ
3.0000 mg | SUBCUTANEOUS | 3 refills | Status: DC
Start: 2020-12-12 — End: 2021-03-06

## 2020-12-12 NOTE — Telephone Encounter (Signed)
[facility]

## 2021-01-30 ENCOUNTER — Telehealth: Payer: Self-pay | Admitting: Endocrinology

## 2021-01-30 NOTE — Telephone Encounter (Signed)
Patient called to advise that she does not feel like herself - "feels like she is here but not here, feels weak, feels like vision is off."  Patient wants to know if her thyroid or her diabetes that is affecting how she feels.  Patient requesting a call [phone]

## 2021-02-02 NOTE — Telephone Encounter (Signed)
LVM for pt to see if she wanted to consider moving up to next available appt to discuss her symptoms. Waiting on response.

## 2021-02-23 ENCOUNTER — Other Ambulatory Visit: Payer: Self-pay | Admitting: Endocrinology

## 2021-02-23 DIAGNOSIS — R609 Edema, unspecified: Secondary | ICD-10-CM

## 2021-02-23 DIAGNOSIS — R600 Localized edema: Secondary | ICD-10-CM

## 2021-02-24 ENCOUNTER — Other Ambulatory Visit: Payer: Self-pay | Admitting: Endocrinology

## 2021-03-06 ENCOUNTER — Other Ambulatory Visit: Payer: Self-pay

## 2021-03-06 ENCOUNTER — Ambulatory Visit (INDEPENDENT_AMBULATORY_CARE_PROVIDER_SITE_OTHER): Payer: Medicaid Other | Admitting: Endocrinology

## 2021-03-06 VITALS — BP 112/60 | HR 90 | Ht 64.0 in | Wt 228.6 lb

## 2021-03-06 DIAGNOSIS — L680 Hirsutism: Secondary | ICD-10-CM | POA: Diagnosis not present

## 2021-03-06 DIAGNOSIS — E1165 Type 2 diabetes mellitus with hyperglycemia: Secondary | ICD-10-CM

## 2021-03-06 LAB — BASIC METABOLIC PANEL
BUN: 23 mg/dL (ref 6–23)
CO2: 25 mEq/L (ref 19–32)
Calcium: 10.5 mg/dL (ref 8.4–10.5)
Chloride: 93 mEq/L — ABNORMAL LOW (ref 96–112)
Creatinine, Ser: 1.04 mg/dL (ref 0.40–1.20)
GFR: 59.47 mL/min — ABNORMAL LOW (ref >60.00)
Glucose, Bld: 316 mg/dL — ABNORMAL HIGH (ref 70–99)
Potassium: 4.6 mEq/L (ref 3.5–5.1)
Sodium: 128 mEq/L — ABNORMAL LOW (ref 135–145)

## 2021-03-06 LAB — POCT GLYCOSYLATED HEMOGLOBIN (HGB A1C): Hemoglobin A1C: 7.3 % — AB (ref 4.0–5.6)

## 2021-03-06 LAB — TSH: TSH: 1.8 u[IU]/mL (ref 0.35–5.50)

## 2021-03-06 MED ORDER — TRULICITY 3 MG/0.5ML ~~LOC~~ SOAJ: 3.0000 mg | SUBCUTANEOUS | 3 refills | Status: DC

## 2021-03-06 NOTE — Progress Notes (Signed)
Subjective:    Patient ID: Sharon Carson, female    DOB: Jun 25, 1960, 60 y.o.   MRN: [id number]  HPI Pt returns for f/u of diabetes mellitus:  DM type: 2 Dx'ed: 8588 Complications: DR and PN Therapy: Trulicity GDM: never DKA: never Severe hypoglycemia: never Pancreatitis: never Pancreatic imaging: normal on 2021 CT SDOH: none Other: she also took insulin for a brief time in 2019; She declines multiple daily injections. It in uncertain if she has ever been on metformin Interval history: no cbg record, but states cbg's vary from 150-313. She has not recently taken the insulin.  [facility] 1.5 [facility]/week She had thyroid US approx 1985.  F/u in 2022 showed diffuse goiter, no nodules.   Past Medical History:  Diagnosis Date   Asthma    CKD (chronic kidney disease), stage III (HCC)    Depression    Diabetes mellitus, type II (HCC)    GERD (gastroesophageal reflux disease)    Hypertension    Hypokalemia    Hypothyroidism    IBS (irritable bowel syndrome)    Sleep apnea     Past Surgical History:  Procedure Laterality Date   ABDOMINAL HYSTERECTOMY     BACK SURGERY     CESAREAN SECTION     RETINAL TEAR REPAIR CRYOTHERAPY     rotar cuff repair Right     Social History   Socioeconomic History   Marital status: Single    Spouse name: Not on file   Number of children: Not on file   Years of education: Not on file   Highest education level: Not on file  Occupational History   Not on file  Tobacco Use   Smoking status: Never   Smokeless tobacco: Never  Vaping Use   Vaping Use: Never used  Substance and Sexual Activity   Alcohol use: No   Drug use: No   Sexual activity: Not Currently    Birth control/protection: Surgical    Comment: Hyst   Other Topics Concern   Not on file  Social History Narrative   Not on file   Social Determinants of Health   Financial Resource Strain: Not on file  Food Insecurity: Not on file  Transportation Needs: Not on  file  Physical Activity: Not on file  Stress: Not on file  Social Connections: Not on file  Intimate Partner Violence: Not on file    Current Outpatient Medications on File Prior to Visit  Medication Sig Dispense Refill   Blood Glucose Monitoring Suppl (ONETOUCH VERIO) w/Device KIT Use 1-4 times daily as needed/directed DX E11.9 1 kit 0   cyclobenzaprine (FLEXERIL) 5 MG tablet Take 5 mg by mouth 3 (three) times daily as needed for muscle spasms.     Insulin Pen Needle (B-D ULTRAFINE III SHORT PEN) 31G X 8 MM MISC 1 each by Does not apply route as directed. 100 each 3   irbesartan (AVAPRO) 150 MG tablet Take 150 mg by mouth daily.      Lancets (ONETOUCH ULTRASOFT) lancets Use 1-4 times daily as needed/directed  DX E11.9 (Patient taking differently: Use 1-4 times daily as needed/directed  DX E11.9) 200 each 12   levothyroxine (SYNTHROID) 137 MCG tablet TAKE 1 TABLET BY MOUTH DAILY BEFORE BREAKFAST. 30 tablet 1   Multiple Vitamin (MULTIVITAMIN WITH MINERALS) TABS tablet Take 1 tablet by mouth daily.      ONETOUCH VERIO test strip Use 1-4 times daily as directed/needed   DX E11.9 (Patient taking differently:  Use 1-4 times daily as directed/needed   DX E11.9) 200 each 12   spironolactone (ALDACTONE) 25 MG tablet TAKE 1 TABLET BY MOUTH DAILY FOR SWELLING 90 tablet 0   No current facility-administered medications on file prior to visit.    Allergies  Allergen Reactions   Hydrochlorothiazide Other (See Comments)    Other reaction(s): itching Renal insufficiency Renal insufficiency    Sulfamethoxazole-Trimethoprim Itching and Rash    Other reaction(s): Itching    Amlodipine Besylate Other (See Comments)    angioedema    Family History  Problem Relation Age of Onset   Thyroid disease Mother    Hypertension Mother    Diabetes Mother    Hypertension Father     BP 112/60   Pulse 90   Ht _0  (1.626 m)   Wt 228 lb 9.6 oz (103.7 kg)   SpO2 97%   BMI 39.24 kg/m    Review of  Systems No menses.      Objective:   Physical Exam Skin: terminal hair on the face.  Lab Results  Component Value Date   CREATININE 1.09 03/07/2020   BUN 15 03/07/2020   NA 132 (L) 03/07/2020   K 4.1 03/07/2020   CL 96 03/07/2020   CO2 26 03/07/2020    Lab Results  Component Value Date   HGBA1C 7.3 (A) 03/06/2021      Assessment & Plan:  type 2 DM: uncontrolled  Patient Instructions  check your blood sugar twice a day.  vary the time of day when you check, between before the 3 meals, and at bedtime.  also check if you have symptoms of your blood sugar being too high or too low.  please keep a record of the readings and bring it to your next appointment here (or you can bring the meter itself).  You can write it on any piece of paper.  please call us sooner if your blood sugar goes below 70, or if you have a lot of readings over 200.  I have sent a prescription to your pharmacy, to increase the Trulicity. Blood tests are requested for you today.  We'll let you know about the results.  Please come back for a follow-up appointment in 3 months.

## 2021-03-06 NOTE — Patient Instructions (Addendum)
check your blood sugar twice a day.  vary the time of day when you check, between before the 3 meals, and at bedtime.  also check if you have symptoms of your blood sugar being too high or too low.  please keep a record of the readings and bring it to your next appointment here (or you can bring the meter itself).  You can write it on any piece of paper.  please call us sooner if your blood sugar goes below 70, or if you have a lot of readings over 200.  I have sent a prescription to your pharmacy, to increase the Trulicity. Blood tests are requested for you today.  We'll let you know about the results.  Please come back for a follow-up appointment in 3 months.

## 2021-03-09 ENCOUNTER — Telehealth: Payer: Self-pay | Admitting: Endocrinology

## 2021-03-09 NOTE — Telephone Encounter (Signed)
LVM for pt to let her know the sodium range as she requested. The range is between 135-145 per Everardo All.

## 2021-03-09 NOTE — Progress Notes (Signed)
Patient has now been advised of her lab results, patient expressed understanding

## 2021-03-09 NOTE — Telephone Encounter (Signed)
PT received a message she states from our office but I do not see this listed. PT wants the sodium level range per the lab results. Pt can be reached at [phone]

## 2021-03-11 NOTE — Telephone Encounter (Signed)
DONE

## 2021-04-08 ENCOUNTER — Other Ambulatory Visit: Payer: Self-pay

## 2021-04-08 DIAGNOSIS — I872 Venous insufficiency (chronic) (peripheral): Secondary | ICD-10-CM

## 2021-04-21 ENCOUNTER — Encounter: Payer: Self-pay | Admitting: Physician Assistant

## 2021-04-21 ENCOUNTER — Ambulatory Visit (HOSPITAL_COMMUNITY)
Admission: RE | Admit: 2021-04-21 | Discharge: 2021-04-21 | Disposition: A | Discharge status: Home / Self Care | Payer: Medicaid Other | Source: Ambulatory Visit | Attending: Vascular Surgery | Admitting: Vascular Surgery

## 2021-04-21 ENCOUNTER — Other Ambulatory Visit: Payer: Self-pay

## 2021-04-21 ENCOUNTER — Ambulatory Visit (INDEPENDENT_AMBULATORY_CARE_PROVIDER_SITE_OTHER): Payer: Medicaid Other | Admitting: Physician Assistant

## 2021-04-21 VITALS — BP 117/80 | HR 86 | Temp 98.0°F | Resp 18 | Ht 64.0 in | Wt 225.2 lb

## 2021-04-21 DIAGNOSIS — M79604 Pain in right leg: Secondary | ICD-10-CM | POA: Diagnosis not present

## 2021-04-21 DIAGNOSIS — I872 Venous insufficiency (chronic) (peripheral): Secondary | ICD-10-CM

## 2021-04-21 DIAGNOSIS — M5441 Lumbago with sciatica, right side: Secondary | ICD-10-CM | POA: Insufficient documentation

## 2021-04-21 DIAGNOSIS — G619 Inflammatory polyneuropathy, unspecified: Secondary | ICD-10-CM | POA: Insufficient documentation

## 2021-04-21 DIAGNOSIS — F418 Other specified anxiety disorders: Secondary | ICD-10-CM | POA: Insufficient documentation

## 2021-04-21 DIAGNOSIS — M79605 Pain in left leg: Secondary | ICD-10-CM

## 2021-04-21 DIAGNOSIS — G622 Polyneuropathy due to other toxic agents: Secondary | ICD-10-CM | POA: Insufficient documentation

## 2021-04-21 DIAGNOSIS — M1990 Unspecified osteoarthritis, unspecified site: Secondary | ICD-10-CM | POA: Insufficient documentation

## 2021-04-21 DIAGNOSIS — R209 Unspecified disturbances of skin sensation: Secondary | ICD-10-CM | POA: Insufficient documentation

## 2021-04-21 NOTE — Progress Notes (Signed)
[facility] [place]   Reason for referral: pain in B legs lateral thighsleg  History of Present Illness  Sharon Carson is a 61 y.o. female who presents with chief complaint: swollen leg.  Patient notes, onset of pain with mild edema several  months ago, associated with sitting and laying down.  The patient has had no history of DVT, no history of varicose vein, no history of venous stasis ulcers, no history of  Lymphedema and no history of skin changes in lower legs.  There is no family history of venous disorders.  The patient has not used compression stockings in the past.  She has had lumbar surgery x 2 with fusion L4-5 and L5-S1   Medical history includes: DM, CKD, and HTN.  Past Medical History:  Diagnosis Date   Asthma    CKD (chronic kidney disease), stage III (HCC)    Depression    Diabetes mellitus, type II (HCC)    GERD (gastroesophageal reflux disease)    Hypertension    Hypokalemia    Hypothyroidism    IBS (irritable bowel syndrome)    Sleep apnea     Past Surgical History:  Procedure Laterality Date   ABDOMINAL HYSTERECTOMY     BACK SURGERY     CESAREAN SECTION     RETINAL TEAR REPAIR CRYOTHERAPY     rotar cuff repair Right     Social History   Socioeconomic History   Marital status: Single    Spouse name: Not on file   Number of children: Not on file   Years of education: Not on file   Highest education level: Not on file  Occupational History   Not on file  Tobacco Use   Smoking status: Never   Smokeless tobacco: Never  Vaping Use   Vaping Use: Never used  Substance and Sexual Activity   Alcohol use: No   Drug use: No   Sexual activity: Not Currently    Birth control/protection: Surgical    Comment: Hyst   Other Topics Concern   Not on file  Social History Narrative   Not on file   Social Determinants of Health   Financial Resource Strain: Not on file  Food Insecurity: Not on file  Transportation Needs: Not on  file  Physical Activity: Not on file  Stress: Not on file  Social Connections: Not on file  Intimate Partner Violence: Not on file    Family History  Problem Relation Age of Onset   Thyroid disease Mother    Hypertension Mother    Diabetes Mother    Hypertension Father     Current Outpatient Medications on File Prior to Visit  Medication Sig Dispense Refill   Blood Glucose Monitoring Suppl (ONETOUCH VERIO) w/Device KIT Use 1-4 times daily as needed/directed DX E11.9 1 kit 0   Dulaglutide (TRULICITY) 3 AS/5.0NL SOPN Inject 3 mg as directed once a week. 6 mL 3   Lancets (ONETOUCH ULTRASOFT) lancets Use 1-4 times daily as needed/directed  DX E11.9 (Patient taking differently: Use 1-4 times daily as needed/directed  DX E11.9) 200 each 12   levothyroxine (SYNTHROID) 137 MCG tablet TAKE 1 TABLET BY MOUTH DAILY BEFORE BREAKFAST. 30 tablet 1   Multiple Vitamin (MULTIVITAMIN WITH MINERALS) TABS tablet Take 1 tablet by mouth daily.      ONETOUCH VERIO test strip Use 1-4 times daily as directed/needed   DX E11.9 (Patient taking differently: Use 1-4 times daily as directed/needed   DX E11.9)  200 each 12   spironolactone (ALDACTONE) 25 MG tablet TAKE 1 TABLET BY MOUTH DAILY FOR SWELLING 90 tablet 0   cyclobenzaprine (FLEXERIL) 5 MG tablet Take 5 mg by mouth 3 (three) times daily as needed for muscle spasms.     Insulin Pen Needle (B-D ULTRAFINE III SHORT PEN) 31G X 8 MM MISC 1 each by Does not apply route as directed. 100 each 3   irbesartan (AVAPRO) 150 MG tablet Take 150 mg by mouth daily.      No current facility-administered medications on file prior to visit.    Allergies as of 04/21/2021 - Review Complete 04/21/2021  Allergen Reaction Noted   Hydrochlorothiazide Other (See Comments) 12/03/2015   Sulfamethoxazole-trimethoprim Itching and Rash 09/10/2014   Amlodipine besylate Other (See Comments) 06/01/2017     ROS:   General:  No weight loss, Fever, chills  HEENT: No recent  headaches, no nasal bleeding, no visual changes, no sore throat  Neurologic: No dizziness, blackouts, seizures. No recent symptoms of stroke or mini- stroke. No recent episodes of slurred speech, or temporary blindness.  Cardiac: No recent episodes of chest pain/pressure, no shortness of breath at rest.  No shortness of breath with exertion.  Denies history of atrial fibrillation or irregular heartbeat  Vascular: No history of rest pain in feet.  No history of claudication.  No history of non-healing ulcer, No history of DVT   Pulmonary: No home oxygen, no productive cough, no hemoptysis,  No asthma or wheezing  Musculoskeletal:  [ ]  Arthritis, [ x] Low back pain,  [ ]  Joint pain  Hematologic:No history of hypercoagulable state.  No history of easy bleeding.  No history of anemia  Gastrointestinal: No hematochezia or melena,  No gastroesophageal reflux, no trouble swallowing  Urinary: [ ]  chronic Kidney disease, [ ]  on HD - [ ]  MWF or [ ]  TTHS, [ ]  Burning with urination, [ ]  Frequent urination, [ ]  Difficulty urinating;   Skin: No rashes  Psychological: No history of anxiety,  No history of depression  Physical Examination  Vitals:   04/21/21 0939  BP: 117/80  Pulse: 86  Resp: 18  Temp: 98 F (36.7 C)  TempSrc: Temporal  SpO2: 96%  Weight: 225 lb 3.2 oz (102.2 kg)  Height: 5\' 4"  (1.626 m)    Body mass index is 38.66 kg/m.  General:  Alert and oriented, no acute distress HEENT: Normal Neck: No bruit or JVD Pulmonary: Clear to auscultation bilaterally Cardiac: Regular Rate and Rhythm without murmur Abdomen: Soft, non-tender, non-distended, no mass, no scars Skin: No rash Extremity Pulses:  2+ radial, brachial, femoral, dorsalis pedis, posterior tibial pulses bilaterally Musculoskeletal: No deformity or no edema on exam today  Neurologic: Upper and lower extremity motor 5/5 and symmetric  DATA:    Venous Reflux Times   +--------------+---------+------+-----------+------------+--------+   RIGHT          Reflux No Reflux Reflux Time Diameter cms Comments                              Yes                                       +--------------+---------+------+-----------+------------+--------+   CFV  yes    >1 second                          +--------------+---------+------+-----------+------------+--------+   FV mid         no                                                   +--------------+---------+------+-----------+------------+--------+   Popliteal      no                                                   +--------------+---------+------+-----------+------------+--------+   GSV at [facility]     no                               0.63                +--------------+---------+------+-----------+------------+--------+   GSV prox thigh no                               0.49                +--------------+---------+------+-----------+------------+--------+   GSV mid thigh  no                               0.36                +--------------+---------+------+-----------+------------+--------+   GSV dist thigh no                               0.44                +--------------+---------+------+-----------+------------+--------+   GSV at knee    no                               0.32                +--------------+---------+------+-----------+------------+--------+   GSV prox calf                                            NV         +--------------+---------+------+-----------+------------+--------+   GSV mid calf                                             NV         +--------------+---------+------+-----------+------------+--------+   SSV Pop Fossa  no                               0.12                +--------------+---------+------+-----------+------------+--------+  SSV prox calf  no                               0.13                 +--------------+---------+------+-----------+------------+--------+   SSV mid calf   no                               0.18                +--------------+---------+------+-----------+------------+--------+   AASV P         no                                                   +--------------+---------+------+-----------+------------+--------+    Summary:  Right:  - No evidence of deep vein thrombosis seen in the right lower extremity,  from the common femoral through the popliteal veins.  - No evidence of superficial venous reflux seen in the right greater  saphenous vein.  - No evidence of superficial venous reflux seen in the right short  saphenous vein.  - Venous reflux is noted in the right common femoral vein.     Assessment: B LE pain with prolonged sitting and supine.   She states she has pain when she is trying to sleep.  Her lateral thighs ache.  She is unable to lay supine or on her stomach since her spinal fusion surgery.  She is able to walk a mile at a time and does pool therapy for her back rehab.    He venous duplex demonstrates no reflux at the [facility] or in the GSV.  She has CFV reflux in the deep system.  She has palpable pedal pulses and is not at risk of limb loss.     Plan:No vascular issue on exam.  She should f/u with her spine physician and maintain a healthy weight with diet and exercise.  F/U PRN.  Roxy Horseman PA-C [facility] [place] Office: [phone]  MD in clinic [place]

## 2021-05-05 ENCOUNTER — Telehealth: Payer: Self-pay

## 2021-05-05 NOTE — Telephone Encounter (Signed)
[employer] from [employer] requesting medical records has now been fax over to MR department.  Faxed to: [phone]

## 2021-05-14 IMAGING — CT CT ABD-PELV W/ CM
1 of 3 series · 14 of 32 positions shown, 19 images · IV contrast (iopamidol)
Comparison: None.

CLINICAL DATA: Diffuse abdominal pain. Worsening pain. Right lower
extremity radiculopathy.

EXAM:
CT ABDOMEN AND PELVIS WITH CONTRAST
TECHNIQUE: Multidetector CT imaging of the abdomen and pelvis was performed
using the standard protocol following bolus administration of
intravenous contrast.
CONTRAST:  125mL LAAFJR-GCC IOPAMIDOL (LAAFJR-GCC) INJECTION 61%

[Series 2: abd/pelvis w/cm · axial · 0.95mm/px · z∈[-382,+8]mm · 14 of 90 slices shown, 19 images]
[im 6/90  soft-tissue]
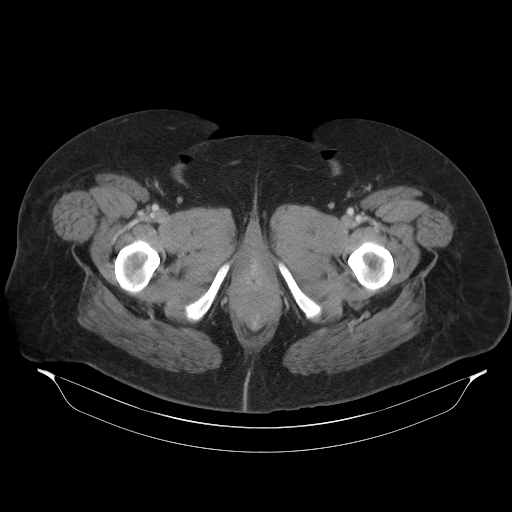
[im 6/90  bone]
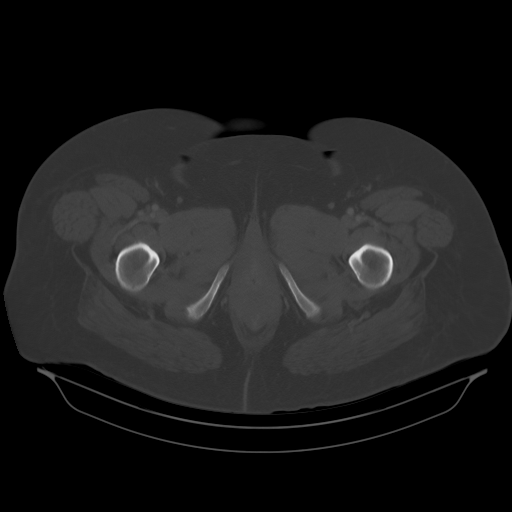
[im 12/90  soft-tissue]
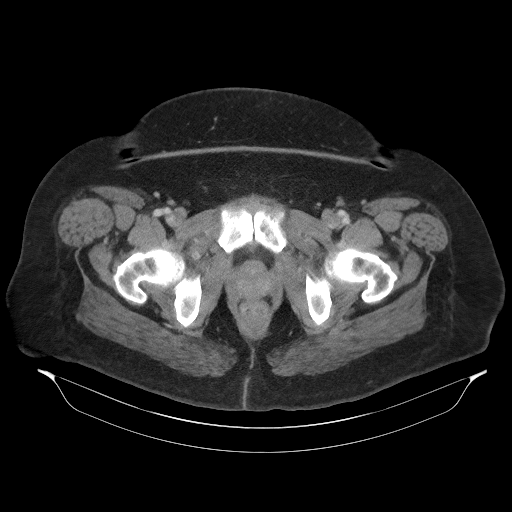
[im 17/90  soft-tissue]
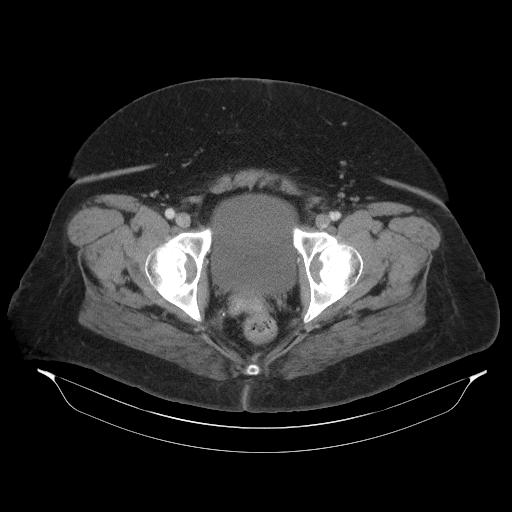
[im 28/90  soft-tissue]
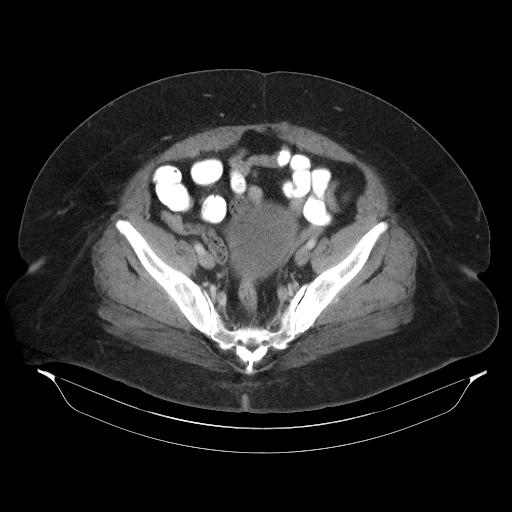
[im 34/90  soft-tissue]
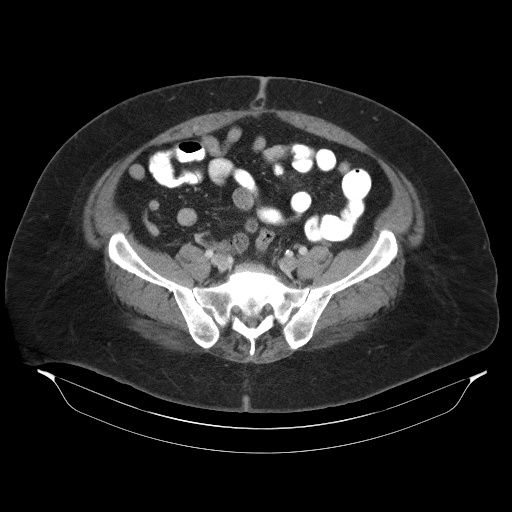
[im 39/90  soft-tissue]
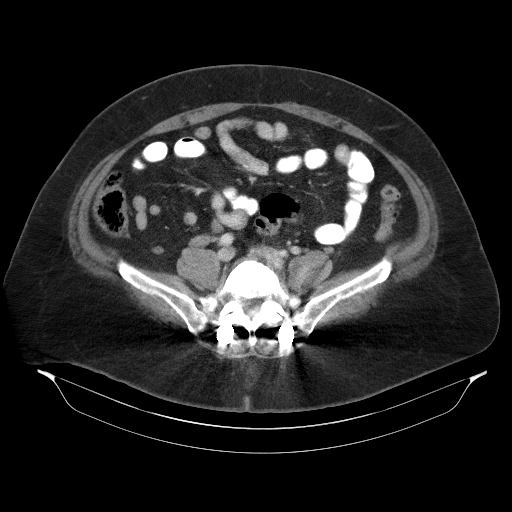
[im 45/90  soft-tissue]
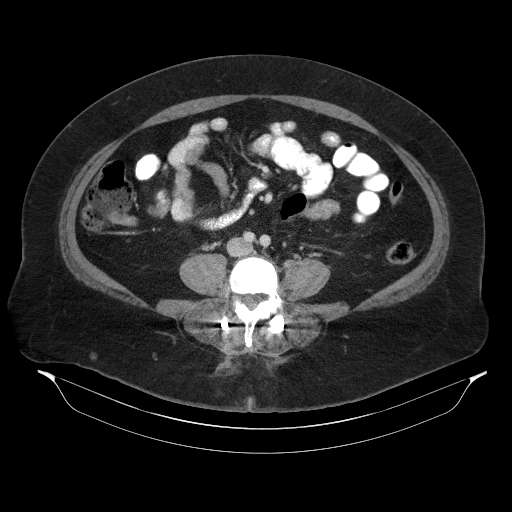
[im 51/90  soft-tissue]
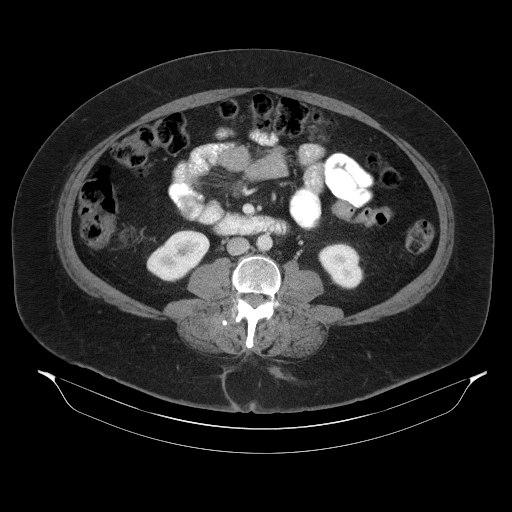
[im 56/90  soft-tissue]
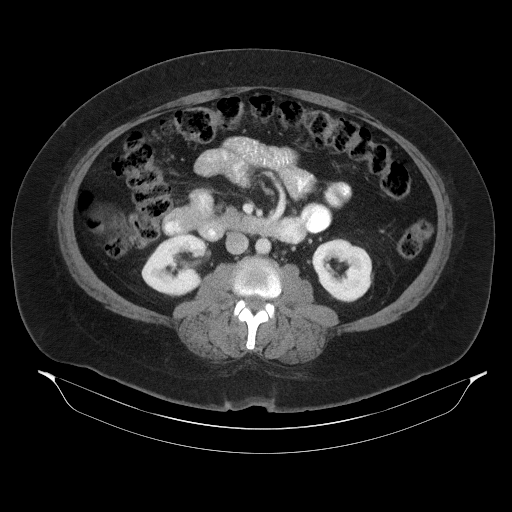
[im 56/90  bone]
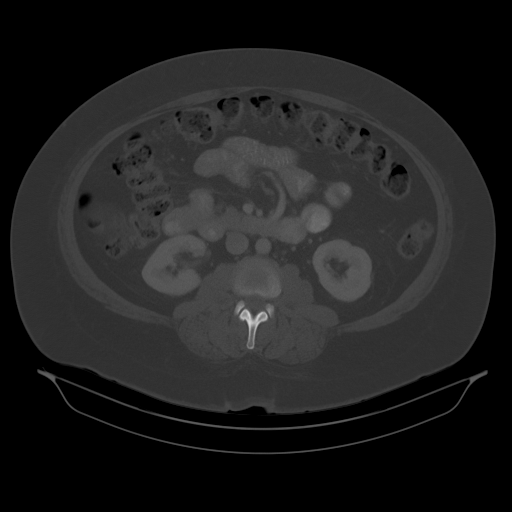
[im 62/90  soft-tissue]
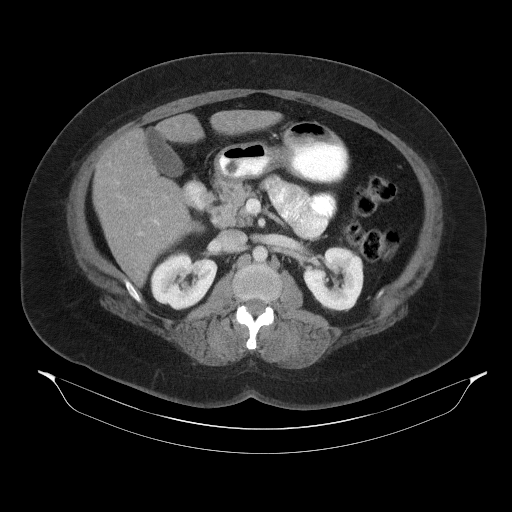
[im 67/90  lung]
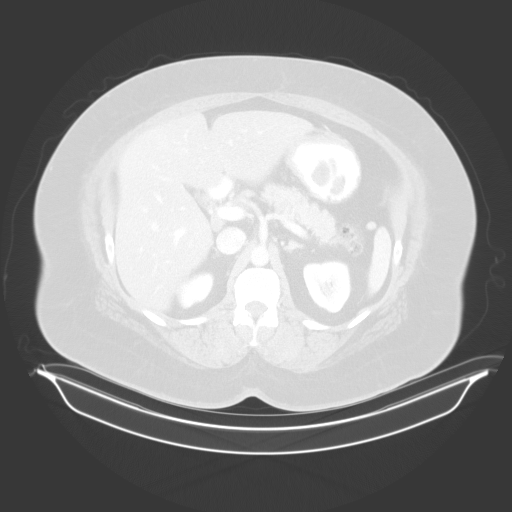
[im 73/90  soft-tissue]
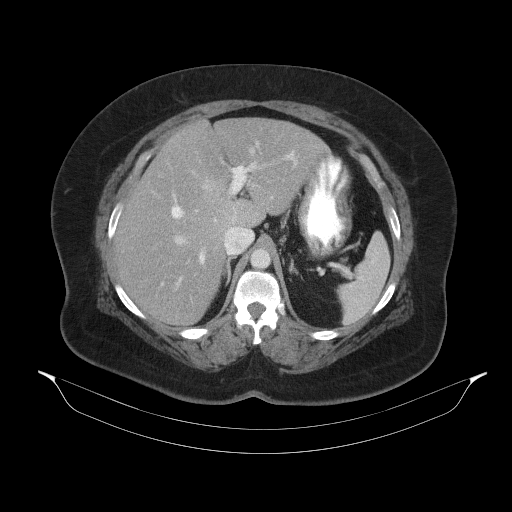
[im 73/90  lung]
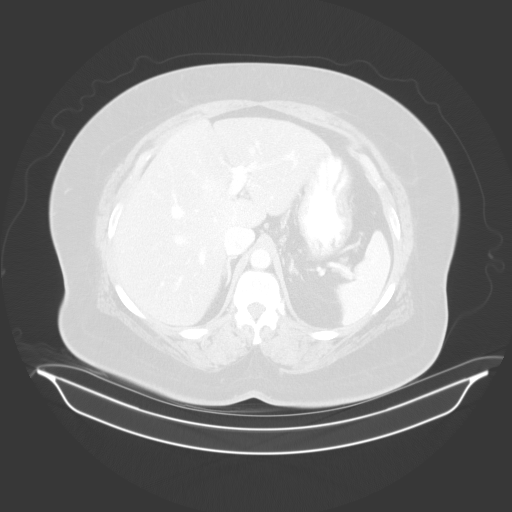
[im 78/90  soft-tissue]
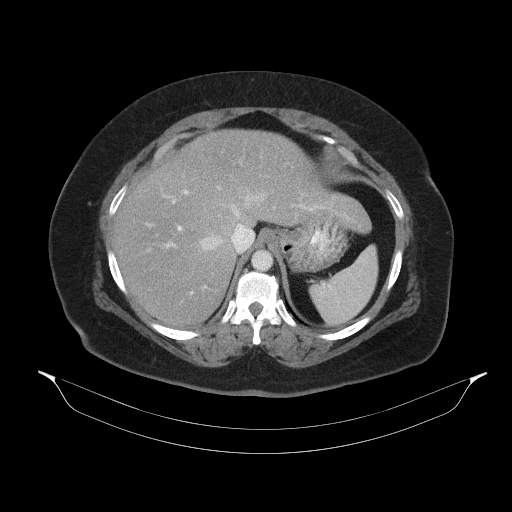
[im 78/90  lung]
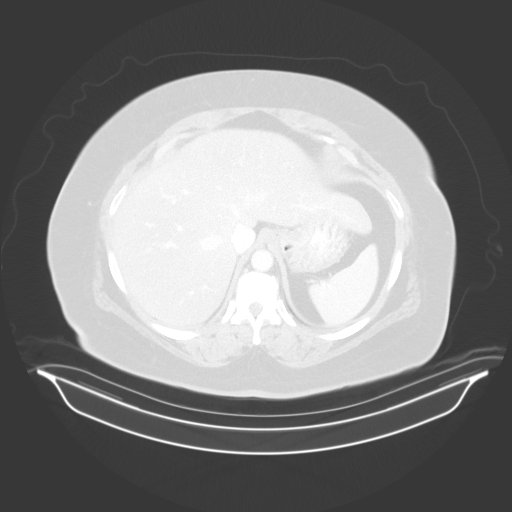
[im 84/90  soft-tissue]
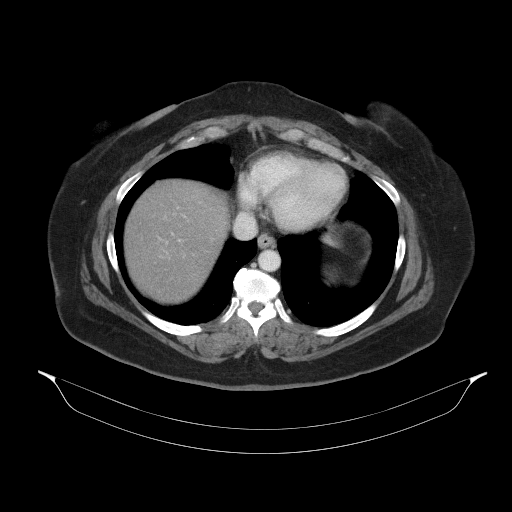
[im 84/90  lung]
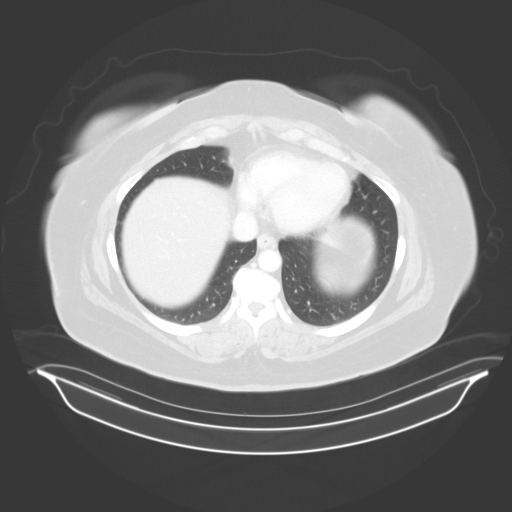

[14 of 32 positions shown; findings below may reference images not displayed]

FINDINGS: Lower chest: The lung bases are clear. The heart size is normal.

Hepatobiliary: There is decreased hepatic attenuation suggestive of
hepatic steatosis. Normal gallbladder.There is no biliary ductal
dilation.

Pancreas: Normal contours without ductal dilatation. No
peripancreatic fluid collection.

Spleen: Unremarkable.

Adrenals/Urinary Tract:

--Adrenal glands: Unremarkable.

--Right kidney/ureter: No hydronephrosis or radiopaque kidney
stones.

--Left kidney/ureter: No hydronephrosis or radiopaque kidney stones.

--Urinary bladder: Unremarkable.

Stomach/Bowel:

--Stomach/Duodenum: No hiatal hernia or other gastric abnormality.
Normal duodenal course and caliber.

--Small bowel: Unremarkable.

--Colon: Unremarkable.

--Appendix: Normal.

Vascular/Lymphatic: Normal course and caliber of the major abdominal
vessels.

--No retroperitoneal lymphadenopathy.

--No mesenteric lymphadenopathy.

--No pelvic or inguinal lymphadenopathy.

Reproductive: Status post hysterectomy. No adnexal mass.

Other: No ascites or free air. There is a small fat containing
umbilical hernia.

Musculoskeletal. The patient is status post prior posterior fusion
from L4 through S1. The hardware appears intact where visualized.
IMPRESSION: 1. No acute abdominopelvic abnormality.
2. Hepatic steatosis.
3. Small fat containing umbilical hernia.

## 2021-05-14 IMAGING — CT CT L SPINE W/O CM
1 of 6 series · 6 of 14 positions shown, 8 images · non-contrast
Comparison: Lumbar MRI 02/23/2020

CLINICAL DATA: Worsening back pain. Right lower extremity
radiculopathy. History of spinal fusion March 2019.

EXAM:
CT LUMBAR SPINE WITHOUT CONTRAST
TECHNIQUE: Multidetector CT imaging of the lumbar spine was performed without
intravenous contrast administration. Multiplanar CT image
reconstructions were also generated.

[Series 3: l spine soft · axial · 0.37mm/px · z∈[-244,-82]mm · 6 of 76 slices shown, 8 images]
[im 11/76  soft-tissue]
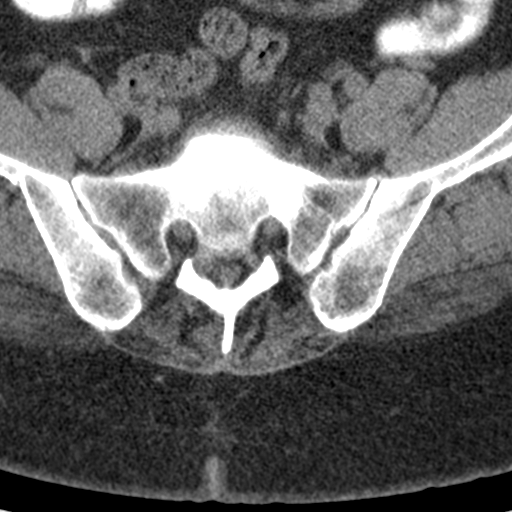
[im 11/76  bone]
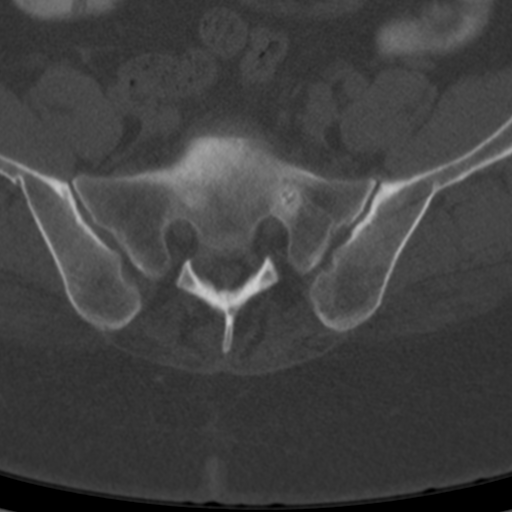
[im 22/76  bone]
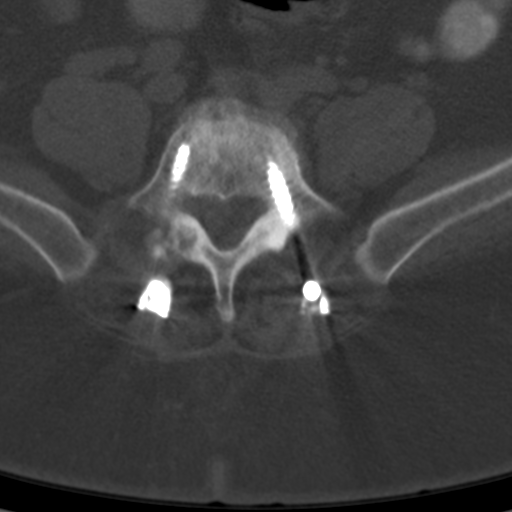
[im 33/76  bone]
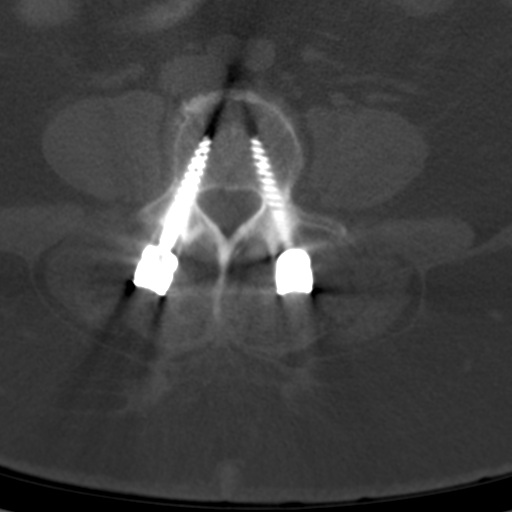
[im 43/76  bone]
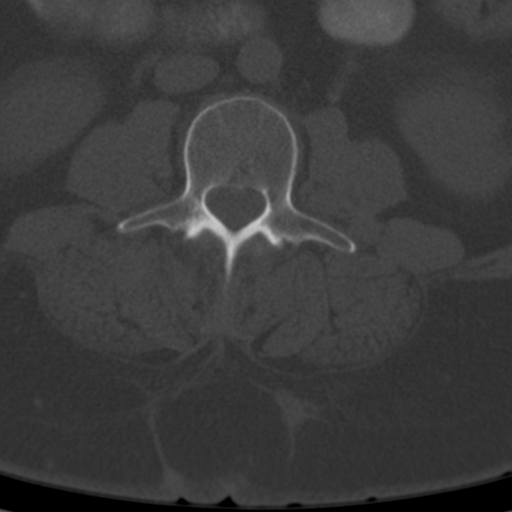
[im 54/76  soft-tissue]
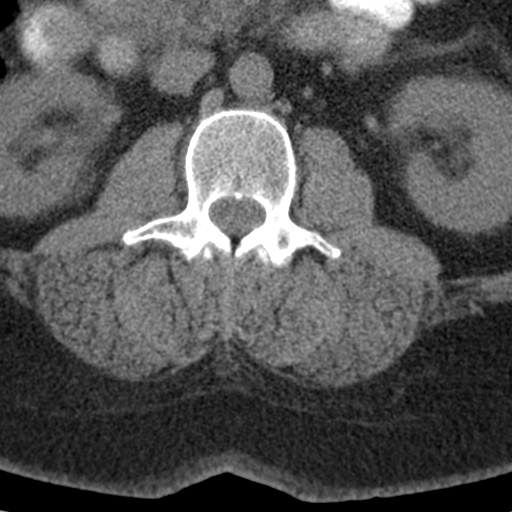
[im 54/76  bone]
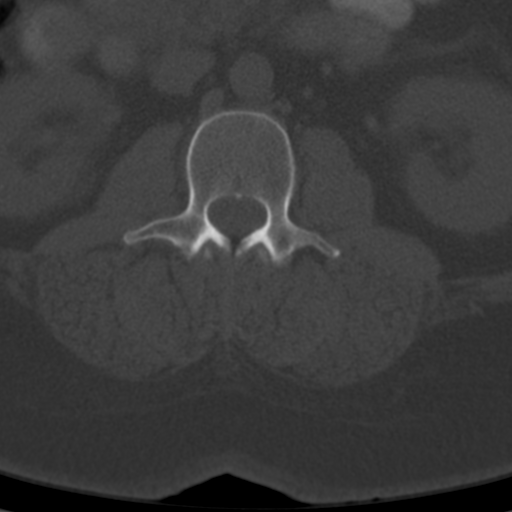
[im 65/76  bone]
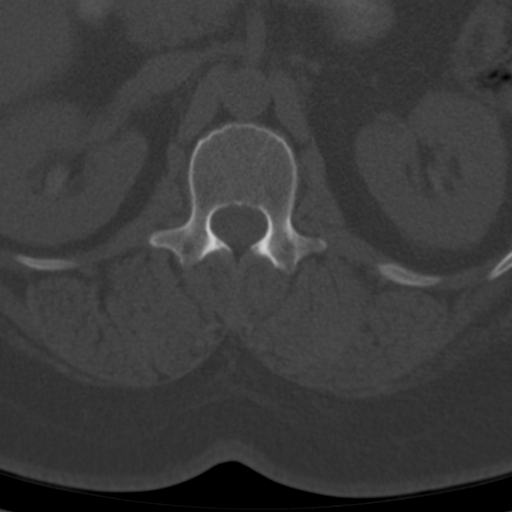

[6 of 14 positions shown; findings below may reference images not displayed]

FINDINGS: Segmentation: 5 lumbar type vertebrae

Alignment: Normal.

Vertebrae: L4-5 and L5-S1 PLIF. Prominent sclerosis around the
intervertebral cages with no convincing bone bridging at L5-S1 and
only minimal possible bridging bone at L4-5. Loosening of the
left-sided pedicle screw at S1. The right pedicle screw at S1 is in
close proximity to the right S1 nerve root.

Paraspinal and other soft tissues: Enteric contrast in collecting
system contrast from recent CT.

Disc levels: L2-3 and L3-4 disc narrowing and bulging with a shallow
left inferior foraminal protrusion suspected at L2-3. Facet
osteoarthritis at L3-4.
IMPRESSION: 1. L4-5 and L5-S1 PLIF with no visible arthrodesis at L5-S1 and
equivocal arthrodesis at L4-5. Left S1 screw loosening.
2. Medial positioning of the right S1 screw, please correlate for
right S1 radiculopathy.
3. L3-4 facet osteoarthritis.

## 2021-06-05 ENCOUNTER — Ambulatory Visit: Payer: BC Managed Care – PPO | Admitting: Endocrinology

## 2021-06-08 ENCOUNTER — Telehealth: Payer: Self-pay | Admitting: Endocrinology

## 2021-06-08 ENCOUNTER — Other Ambulatory Visit: Payer: Self-pay | Admitting: Endocrinology

## 2021-06-08 MED ORDER — TRULICITY 1.5 MG/0.5ML ~~LOC~~ SOAJ: 3.0000 mg | SUBCUTANEOUS | 3 refills | Status: DC

## 2021-06-08 NOTE — Telephone Encounter (Signed)
Patient has now been informed of rx being sent in. ?

## 2021-06-08 NOTE — Telephone Encounter (Signed)
Patient called to advise that her Trulicity RX is reflecting 1.5 MG not 3 MG. [facility] [address] in [place].  She is out of medication and her appointments is not until 06/15/21. ? ?Any questions please contact patient at [phone] ?

## 2021-06-10 ENCOUNTER — Ambulatory Visit: Payer: Medicaid Other | Admitting: Endocrinology

## 2021-06-15 ENCOUNTER — Other Ambulatory Visit: Payer: Self-pay

## 2021-06-15 ENCOUNTER — Ambulatory Visit (INDEPENDENT_AMBULATORY_CARE_PROVIDER_SITE_OTHER): Payer: BLUE CROSS/BLUE SHIELD | Admitting: Endocrinology

## 2021-06-15 VITALS — BP 122/84 | HR 101 | Ht 64.0 in | Wt 218.2 lb

## 2021-06-15 DIAGNOSIS — E1165 Type 2 diabetes mellitus with hyperglycemia: Secondary | ICD-10-CM

## 2021-06-15 LAB — POCT GLYCOSYLATED HEMOGLOBIN (HGB A1C): Hemoglobin A1C: 7.4 % — AB (ref 4.0–5.6)

## 2021-06-15 MED ORDER — TRULICITY 3 MG/0.5ML ~~LOC~~ SOAJ: 3.0000 mg | SUBCUTANEOUS | 3 refills | Status: AC

## 2021-06-15 NOTE — Patient Instructions (Addendum)
check your blood sugar twice a day.  vary the time of day when you check, between before the 3 meals, and at bedtime.  also check if you have symptoms of your blood sugar being too high or too low.  please keep a record of the readings and bring it to your next appointment here (or you can bring the meter itself).  You can write it on any piece of paper.  please call us sooner if your blood sugar goes below 70, or if you have a lot of readings over 200.   ?I have sent a prescription to your [facility], to increase the Trulicity.   ?Please come back for a follow-up appointment in 2 months.   ? ? ?

## 2021-06-15 NOTE — Progress Notes (Signed)
? ?Subjective:  ? ? Patient ID: Sharon Carson, female    DOB: [birth year], 61 y.o.   MRN: [id number] ? ?HPI ?Pt returns for f/u of diabetes mellitus:  ?DM type: 2 ?Dx'ed: 2016 ?Complications: DR and PN ?Therapy: Trulicity ?GDM: never ?DKA: never ?Severe hypoglycemia: never ?Pancreatitis: never ?Pancreatic imaging: normal on 2021 CT ?SDOH: none ?Other: she also took insulin for a brief time in 2019; She declines multiple daily injections. It in uncertain if she has ever been on metformin ?Interval history: no cbg record, but states cbg's vary from 150-313. She has not recently taken the insulin.  She is having trouble obtaining Trulicity 3 mg.  This was changed to 2x1.5 mg/week, but she takes just 1x1.5 mg.   ?She had thyroid US approx 1985.  F/u in 2022 showed diffuse goiter, no nodules.   ?Past Medical History:  ?Diagnosis Date  ? Asthma   ? CKD (chronic kidney disease), stage III ([place])   ? Depression   ? Diabetes mellitus, type II ([place])   ? GERD (gastroesophageal reflux disease)   ? Hypertension   ? Hypokalemia   ? Hypothyroidism   ? IBS (irritable bowel syndrome)   ? Sleep apnea   ? ? ?Past Surgical History:  ?Procedure Laterality Date  ? ABDOMINAL HYSTERECTOMY    ? BACK SURGERY    ? CESAREAN SECTION    ? RETINAL TEAR REPAIR CRYOTHERAPY    ? rotar cuff repair Right   ? ? ?Social History  ? ?Socioeconomic History  ? Marital status: Single  ?  Spouse name: Not on file  ? Number of children: Not on file  ? Years of education: Not on file  ? Highest education level: Not on file  ?Occupational History  ? Not on file  ?Tobacco Use  ? Smoking status: Never  ? Smokeless tobacco: Never  ?Vaping Use  ? Vaping Use: Never used  ?Substance and Sexual Activity  ? Alcohol use: No  ? Drug use: No  ? Sexual activity: Not Currently  ?  Birth control/protection: Surgical  ?  Comment: Hyst   ?Other Topics Concern  ? Not on file  ?Social History Narrative  ? Not on file  ? ?Social Determinants of Health  ? ?Financial Resource  Strain: Not on file  ?Food Insecurity: Not on file  ?Transportation Needs: Not on file  ?Physical Activity: Not on file  ?Stress: Not on file  ?Social Connections: Not on file  ?Intimate Partner Violence: Not on file  ? ? ?Current Outpatient Medications on File Prior to Visit  ?Medication Sig Dispense Refill  ? Blood Glucose Monitoring Suppl (ONETOUCH VERIO) w/Device KIT Use 1-4 times daily as needed/directed DX E11.9 1 kit 0  ? Lancets (ONETOUCH ULTRASOFT) lancets Use 1-4 times daily as needed/directed  DX E11.9 (Patient taking differently: Use 1-4 times daily as needed/directed  DX E11.9) 200 each 12  ? levothyroxine (SYNTHROID) 137 MCG tablet TAKE 1 TABLET BY MOUTH DAILY BEFORE BREAKFAST. 30 tablet 1  ? Multiple Vitamin (MULTIVITAMIN WITH MINERALS) TABS tablet Take 1 tablet by mouth daily.     ? ONETOUCH VERIO test strip Use 1-4 times daily as directed/needed   DX E11.9 (Patient taking differently: Use 1-4 times daily as directed/needed   DX E11.9) 200 each 12  ? spironolactone (ALDACTONE) 25 MG tablet TAKE 1 TABLET BY MOUTH DAILY FOR SWELLING 90 tablet 0  ? cyclobenzaprine (FLEXERIL) 5 MG tablet Take 5 mg by mouth 3 (three) times daily as needed  for muscle spasms.    ? Insulin Pen Needle (B-D ULTRAFINE III SHORT PEN) 31G X 8 MM MISC 1 each by Does not apply route as directed. 100 each 3  ? irbesartan (AVAPRO) 150 MG tablet Take 150 mg by mouth daily.     ? ?No current facility-administered medications on file prior to visit.  ? ? ?Allergies  ?Allergen Reactions  ? Hydrochlorothiazide Other (See Comments)  ?  Other reaction(s): itching ?Renal insufficiency ?Renal insufficiency ?  ? Sulfamethoxazole-Trimethoprim Itching and Rash  ?  Other reaction(s): Itching ?  ? Amlodipine Besylate Other (See Comments)  ?  angioedema  ? ? ?Family History  ?Problem Relation Age of Onset  ? Thyroid disease Mother   ? Hypertension Mother   ? Diabetes Mother   ? Hypertension Father   ? ? ?BP 122/84 (BP Location: Left Arm, Patient  Position: Sitting, Cuff Size: Normal)   Pulse (!) 101   Ht _0  (1.626 m)   Wt 218 lb 3.2 oz (99 kg)   SpO2 97%   BMI 37.45 kg/m?  ? ? ?Review of Systems ?Denies N/HB ?   ?Objective:  ? Physical Exam ?VITAL SIGNS:  See vs page ?GENERAL: no distress ? ? ?A1c=7.4% ?   ?Assessment & Plan:  ?Type 2 DM: uncontrolled ? ?Patient Instructions  ?check your blood sugar twice a day.  vary the time of day when you check, between before the 3 meals, and at bedtime.  also check if you have symptoms of your blood sugar being too high or too low.  please keep a record of the readings and bring it to your next appointment here (or you can bring the meter itself).  You can write it on any piece of paper.  please call us sooner if your blood sugar goes below 70, or if you have a lot of readings over 200.   ?I have sent a prescription to your [facility], to increase the Trulicity.   ?Please come back for a follow-up appointment in 2 months.   ? ? ? ? ?

## 2021-08-18 ENCOUNTER — Ambulatory Visit: Payer: Medicaid Other | Admitting: Endocrinology

## 2021-11-03 ENCOUNTER — Ambulatory Visit: Payer: BLUE CROSS/BLUE SHIELD | Admitting: Internal Medicine

## 2021-11-03 NOTE — Progress Notes (Deleted)
Patient ID: Sharon Carson, female   DOB: 02-07-1961, 61 y.o.   MRN: [id number]  HPI: Sharon Carson is a 61 y.o.-year-old female, returning for follow-up for DM2, dx in 2016, non-insulin-dependent, uncontrolled, with complications (CKD stage III, DR, PN) and hypothyroidism. Pt. previously saw Dr. Loanne Drilling, last visit 4 mo ago.  DM2: Reviewed HbA1c: Lab Results  Component Value Date   HGBA1C 7.4 (A) 06/15/2021   HGBA1C 7.3 (A) 03/06/2021   HGBA1C 7.6 (A) 12/05/2020   HGBA1C 7.0 (A) 08/26/2020   HGBA1C 8.6 (A) 06/25/2020   HGBA1C 12.7 (H) 03/07/2020   HGBA1C 11.4 (H) 08/14/2019   HGBA1C 10.4 04/26/2017   HGBA1C 12 01/28/2017   HGBA1C 11.8 08/26/2016   Pt is on a regimen of: - Trulicity 3 mg weekly She took insulin in 2019, but not currently.  Pt checks her sugars *** a day and they are: - am: n/c - 2h after b'fast: n/c - before lunch: n/c - 2h after lunch: n/c - before dinner: n/c - 2h after dinner: n/c - bedtime: n/c - nighttime: n/c Lowest sugar was ***; she has hypoglycemia awareness at 70.  Highest sugar was ***.  Glucometer: One Touch Verio  Pt's meals are: - Breakfast: - Lunch: - Dinner: - Snacks:  - no CKD, last BUN/creatinine:  09/14/2021: Sodium 135 - 146 MMOL/L 136   Potassium 3.5 - 5.3 MMOL/L 5.0   Chloride 98 - 110 MMOL/L 102   CO2 23 - 30 MMOL/L 26   BUN 8 - 24 MG/DL 19   Glucose 70 - 99 MG/DL 96   Creatinine 0.50 - 1.50 MG/DL 1.42   Calcium 8.5 - 10.5 MG/DL 9.9   Anion Gap 4 - 14 MMOL/L 8   Est. GFR >=60 ML/MIN/1.73 M*2 43 Low     Lab Results  Component Value Date   BUN 23 03/06/2021   BUN 15 03/07/2020   CREATININE 1.04 03/06/2021   CREATININE 1.09 03/07/2020  She is on Avapro 150 mg daily.  - + HL; last set of lipids: 04/24/2021:  Ref Range & Units 6 mo ago  Total Cholesterol 25 - 199 MG/DL 275 High    Triglycerides 10 - 150 MG/DL 157 High    HDL Cholesterol 35 - 135 MG/DL 56   LDL Cholesterol Calculated 0 - 99 MG/DL 188 High     Total Chol / HDL Cholesterol <4.5 4.9 High    Non-HDL Cholesterol MG/DL 219    No results found for: "CHOL", "HDL", "LDLCALC", "LDLDIRECT", "TRIG", "CHOLHDL"  - last eye exam was in ***. + DR.   - + numbness and tingling in her feet.  Last foot exam 12/05/2020.  She also has a history of hypothyroidism:  Pt is on levothyroxine 137 mcg daily, taken: - in am - fasting - at least 30 min from b'fast - no calcium - no iron - no multivitamins - no PPIs - not on Biotin  Reviewed thyroid tests: 09/14/2021: TSH 0.45 - 5.00 UIU/ML 4.71   T4 TOTAL 5.5 - 11.8 mcg/dL 8.6   T3 Uptake 32.0 - 48.4 % 44.7   Free Thyroxine Index 4.8 - 11.3 9.9   04/24/2021: TSH 0.45 - 5.33 UIU/ML 5.52 High    Lab Results  Component Value Date   TSH 1.80 03/06/2021   TSH 10.78 (H) 04/17/2020   TSH 9.14 (H) 03/07/2020   TSH 6.49 (H) 01/29/2020   She also has a history of HTN, hypokalemia, GERD, OSA, asthma, IBS.  ROS: +  see HPI No increased urination, blurry vision, nausea, chest pain.  Past Medical History:  Diagnosis Date   Asthma    CKD (chronic kidney disease), stage III ([facility])    Depression    Diabetes mellitus, type II ([facility])    GERD (gastroesophageal reflux disease)    Hypertension    Hypokalemia    Hypothyroidism    IBS (irritable bowel syndrome)    Sleep apnea    Past Surgical History:  Procedure Laterality Date   ABDOMINAL HYSTERECTOMY     BACK SURGERY     CESAREAN SECTION     RETINAL TEAR REPAIR CRYOTHERAPY     rotar cuff repair Right    Social History   Socioeconomic History   Marital status: Single    Spouse name: Not on file   Number of children: Not on file   Years of education: Not on file   Highest education level: Not on file  Occupational History   Not on file  Tobacco Use   Smoking status: Never   Smokeless tobacco: Never  Vaping Use   Vaping Use: Never used  Substance and Sexual Activity   Alcohol use: No   Drug use: No   Sexual activity: Not Currently     Birth control/protection: Surgical    Comment: Hyst   Other Topics Concern   Not on file  Social History Narrative   Not on file   Social Determinants of Health   Financial Resource Strain: Not on file  Food Insecurity: Not on file  Transportation Needs: Not on file  Physical Activity: Not on file  Stress: Not on file  Social Connections: Not on file  Intimate Partner Violence: Not on file   Current Outpatient Medications on File Prior to Visit  Medication Sig Dispense Refill   Blood Glucose Monitoring Suppl (ONETOUCH VERIO) w/Device KIT Use 1-4 times daily as needed/directed DX E11.9 1 kit 0   cyclobenzaprine (FLEXERIL) 5 MG tablet Take 5 mg by mouth 3 (three) times daily as needed for muscle spasms.     Dulaglutide (TRULICITY) 3 DP/8.2UM SOPN Inject 3 mg as directed once a week. 6 mL 3   Insulin Pen Needle (B-D ULTRAFINE III SHORT PEN) 31G X 8 MM MISC 1 each by Does not apply route as directed. 100 each 3   irbesartan (AVAPRO) 150 MG tablet Take 150 mg by mouth daily.      Lancets (ONETOUCH ULTRASOFT) lancets Use 1-4 times daily as needed/directed  DX E11.9 (Patient taking differently: Use 1-4 times daily as needed/directed  DX E11.9) 200 each 12   levothyroxine (SYNTHROID) 137 MCG tablet TAKE 1 TABLET BY MOUTH DAILY BEFORE BREAKFAST. 30 tablet 1   Multiple Vitamin (MULTIVITAMIN WITH MINERALS) TABS tablet Take 1 tablet by mouth daily.      ONETOUCH VERIO test strip Use 1-4 times daily as directed/needed   DX E11.9 (Patient taking differently: Use 1-4 times daily as directed/needed   DX E11.9) 200 each 12   spironolactone (ALDACTONE) 25 MG tablet TAKE 1 TABLET BY MOUTH DAILY FOR SWELLING 90 tablet 0   No current facility-administered medications on file prior to visit.   Allergies  Allergen Reactions   Hydrochlorothiazide Other (See Comments)    Other reaction(s): itching Renal insufficiency Renal insufficiency    Sulfamethoxazole-Trimethoprim Itching and Rash    Other  reaction(s): Itching    Amlodipine Besylate Other (See Comments)    angioedema   Family History  Problem Relation Age of Onset  Thyroid disease Mother    Hypertension Mother    Diabetes Mother    Hypertension Father     PE: There were no vitals taken for this visit. Wt Readings from Last 3 Encounters:  06/15/21 218 lb 3.2 oz (99 kg)  04/21/21 225 lb 3.2 oz (102.2 kg)  03/06/21 228 lb 9.6 oz (103.7 kg)   Constitutional: overweight, in NAD Eyes: no exophthalmos ENT: moist mucous membranes, no thyromegaly, no cervical lymphadenopathy Cardiovascular: RRR, No MRG Respiratory: CTA B Musculoskeletal: no deformities Skin: moist, warm, no rashes Neurological: no tremor with outstretched hands  ASSESSMENT: 1. DM2, non-insulin-dependent, uncontrolled, with complications - CKD stage 3 - DR - PN  2.  Hyperlipidemia  3. Hypothyroidism  PLAN:  1. Patient with long-standing, uncontrolled diabetes, on injectable antidiabetic regimen with weekly GLP-1 receptor agonist only, with improving control: Latest HbA1c from 09/14/2021 was lower, at 7.1%. - I suggested to:  There are no Patient Instructions on file for this visit. - check sugars at different times of the day - check 1x a day, rotating checks - discussed about CBG targets for treatment: 80-130 mg/dL before meals and <180 mg/dL after meals; target HbA1c <7%. - given sugar log and advised how to fill it and to bring it at next appt  - given foot care handout  - given instructions for hypoglycemia management "15-15 rule"  - advised for yearly eye exams  - Return to clinic in 3 mo with sugar log   2.  Hyperlipidemia - Reviewed latest lipid panel from 04/2021: very high LDL, at 188 - Continues the statin without side effects.  3.  Hypothyroidism - Previously uncontrolled - latest thyroid labs reviewed with pt. >> normal in 09/2021 - she continues on LT4 137 mcg daily - pt feels good on this dose. - we discussed about taking  the thyroid hormone every day, with water, >30 minutes before breakfast, separated by >4 hours from acid reflux medications, calcium, iron, multivitamins. Pt. is taking it correctly. - will check thyroid tests at next visit  Philemon Kingdom, MD PhD [facility] Endocrinology
